# Patient Record
Sex: Male | Born: 1955 | Race: White | Hispanic: No | Marital: Married | Smoking: Never smoker
Health system: Southern US, Community
[De-identification: ages and names within clinical notes are randomized; demographics above are authoritative.]

## PROBLEM LIST (undated history)

## (undated) DIAGNOSIS — F988 Other specified behavioral and emotional disorders with onset usually occurring in childhood and adolescence: Secondary | ICD-10-CM

## (undated) DIAGNOSIS — M109 Gout, unspecified: Secondary | ICD-10-CM

## (undated) DIAGNOSIS — E119 Type 2 diabetes mellitus without complications: Secondary | ICD-10-CM

## (undated) DIAGNOSIS — G4730 Sleep apnea, unspecified: Secondary | ICD-10-CM

## (undated) DIAGNOSIS — E785 Hyperlipidemia, unspecified: Secondary | ICD-10-CM

## (undated) DIAGNOSIS — I10 Essential (primary) hypertension: Secondary | ICD-10-CM

## (undated) HISTORY — DX: Other specified behavioral and emotional disorders with onset usually occurring in childhood and adolescence: F98.8

## (undated) HISTORY — PX: APPENDECTOMY: SHX54

## (undated) HISTORY — PX: HERNIA REPAIR: SHX51

## (undated) HISTORY — DX: Type 2 diabetes mellitus without complications: E11.9

## (undated) HISTORY — PX: FRACTURE SURGERY: SHX138

## (undated) HISTORY — DX: Sleep apnea, unspecified: G47.30

## (undated) HISTORY — DX: Hyperlipidemia, unspecified: E78.5

---

## 2004-05-04 ENCOUNTER — Ambulatory Visit: Payer: Self-pay | Admitting: Family Medicine

## 2008-10-30 ENCOUNTER — Ambulatory Visit: Payer: Self-pay | Admitting: Family Medicine

## 2008-11-13 ENCOUNTER — Ambulatory Visit: Payer: Self-pay | Admitting: Family Medicine

## 2009-06-17 DIAGNOSIS — I152 Hypertension secondary to endocrine disorders: Secondary | ICD-10-CM | POA: Insufficient documentation

## 2009-06-17 DIAGNOSIS — E559 Vitamin D deficiency, unspecified: Secondary | ICD-10-CM | POA: Insufficient documentation

## 2011-03-29 IMAGING — CR CERVICAL SPINE - 2-3 VIEW
1 series · 4 of 4 positions shown · non-contrast
Comparison: none

REASON FOR EXAM: pneumonia, neck pain
COMMENTS:

[Series 2: view not recorded · 0.17mm/px · 4 of 4 slices shown]
[im 1/4]
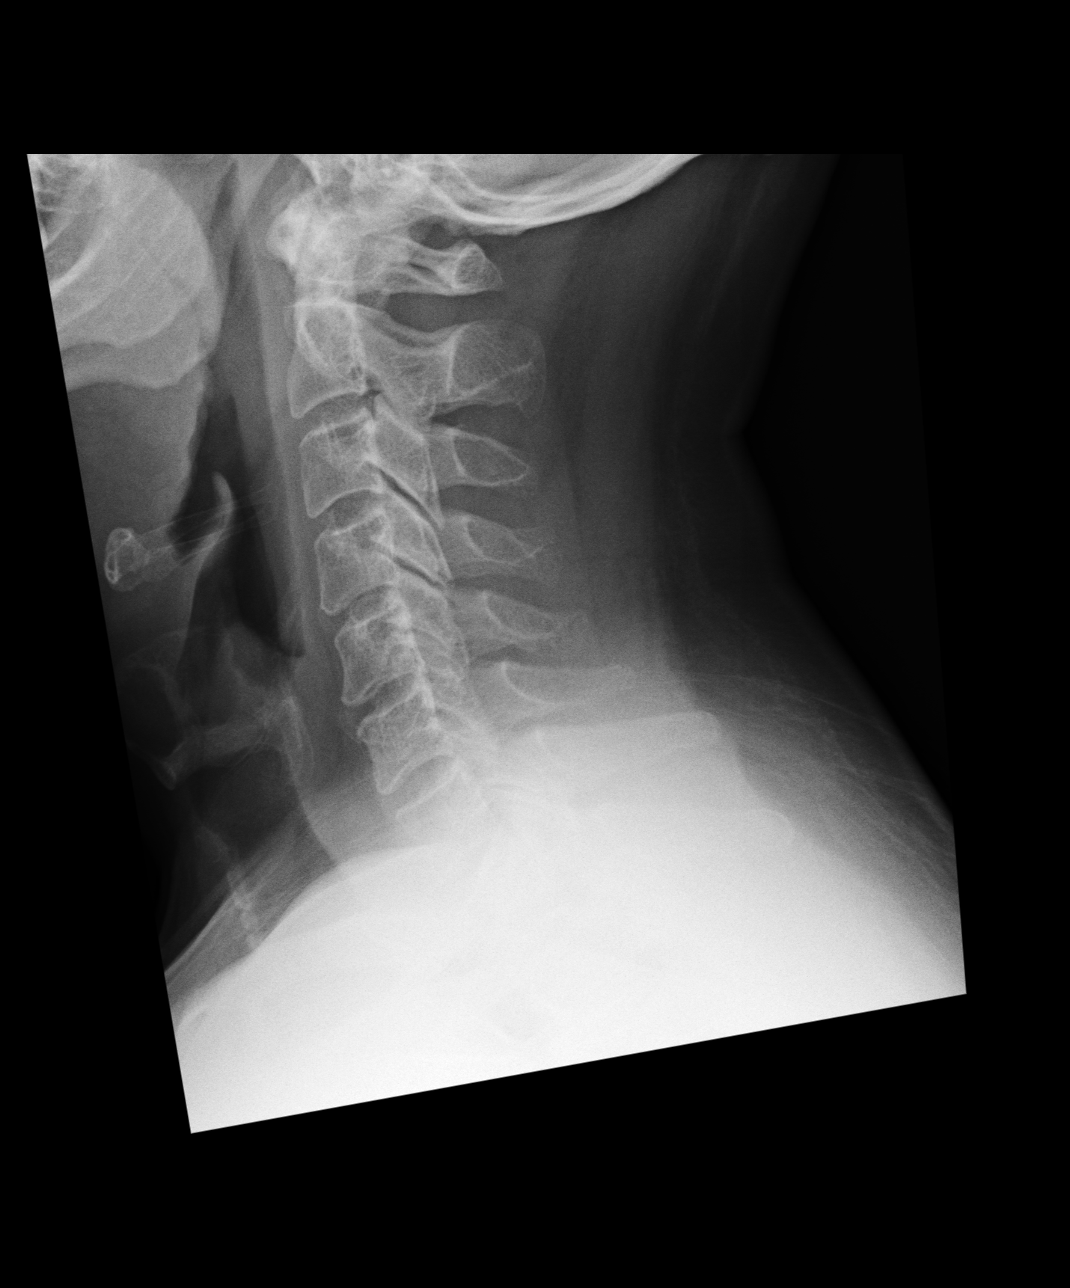
[im 2/4]
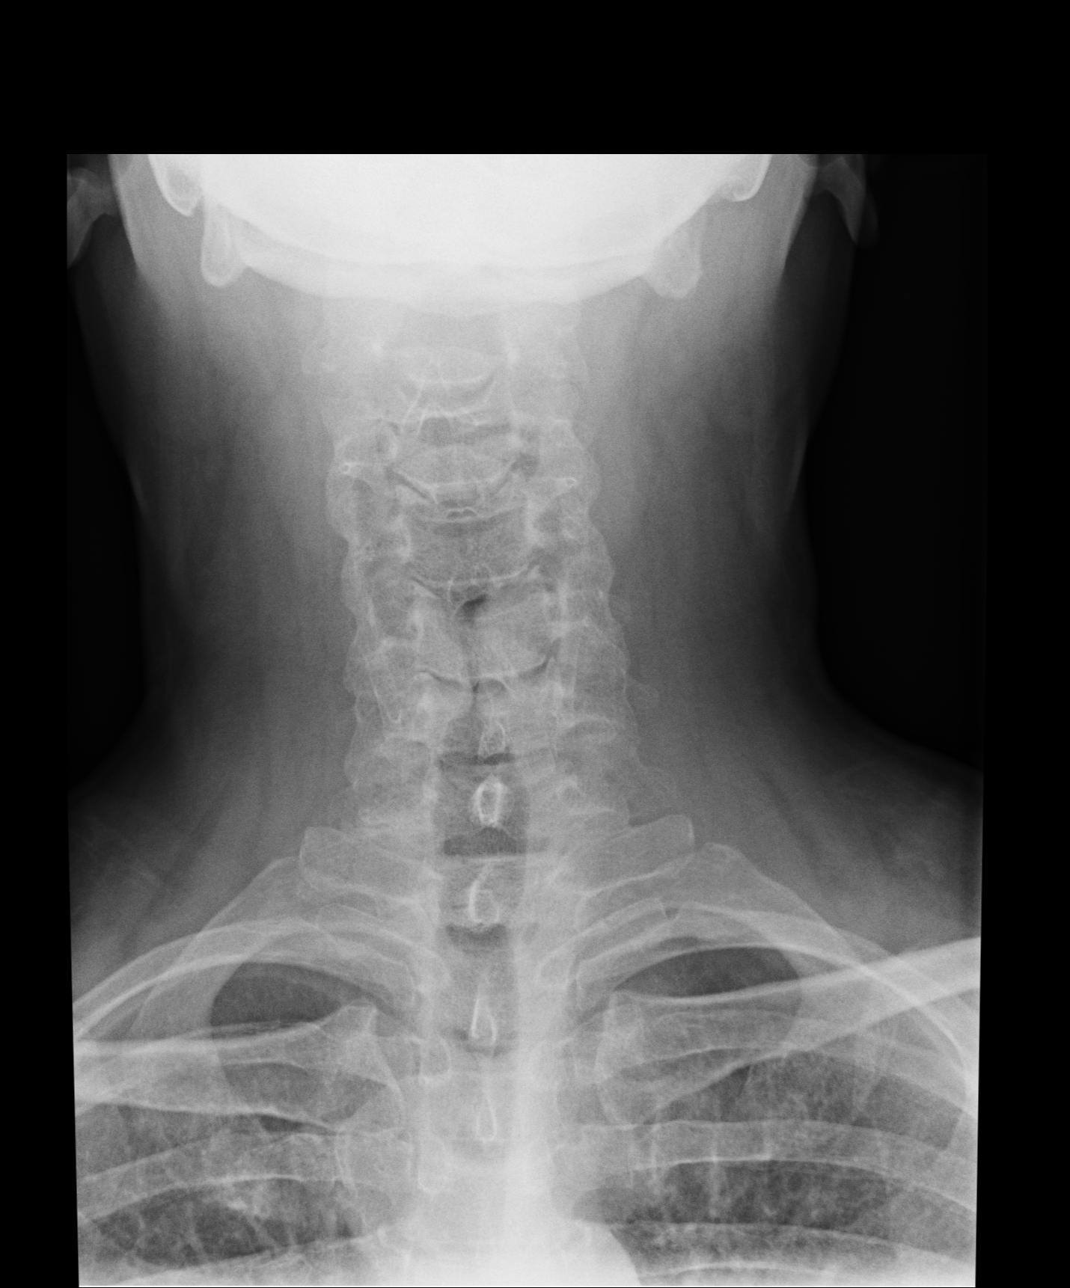
[im 3/4]
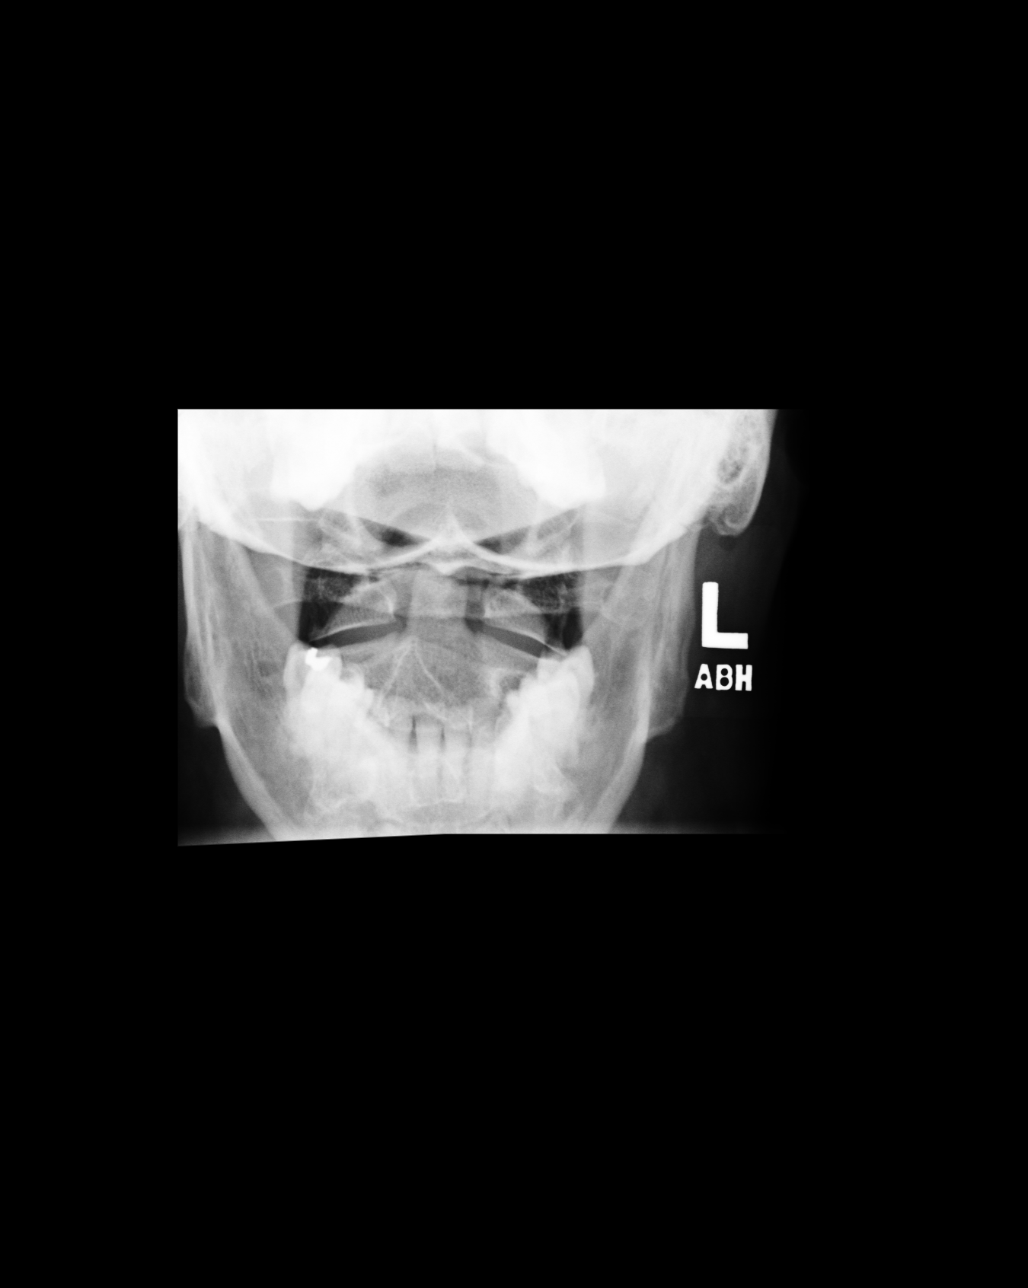
[im 4/4]
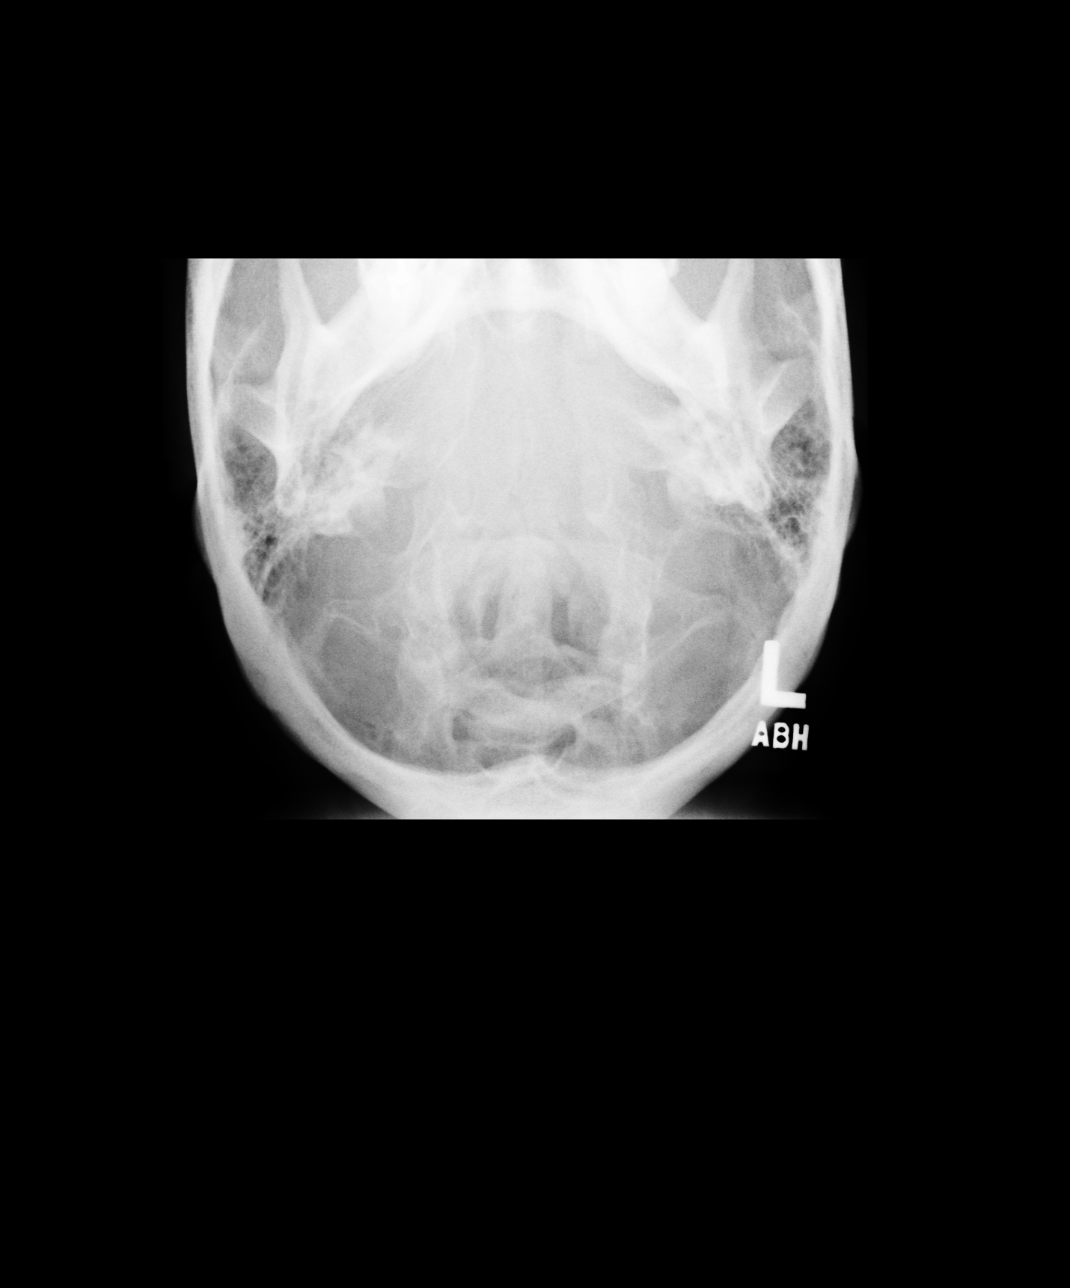

[4 of 4 positions shown; findings below may reference images not displayed]

PROCEDURE:     KDR - KDXR C-SPINE AP AND LATERAL  - October 30, 2008 [DATE]

RESULT:     The cervical vertebral bodies are preserved in height. There is
gentle curvature of the lower and upper cervical spine which may be related
to muscle spasm. The odontoid appears intact and the lateral masses of C1
align normally with those of C2. The prevertebral soft tissue spaces appear
normal. The posterior elements are grossly intact.
IMPRESSION: 1. I do not see acute bony abnormality of the cervical spine nor evidence of
more than very minimal degenerative type change.
2. There may be muscle spasm present.
3. I do not see evidence of soft tissue gas nor evidence of mass effect upon
the cervical airway.

## 2011-04-12 IMAGING — CR DG CHEST 2V
1 series · 2 of 2 positions shown · non-contrast
Comparison: none

REASON FOR EXAM: pneumonia
COMMENTS:

[Series 1: view not recorded · 0.17mm/px · 2 of 2 slices shown]
[im 1/2]
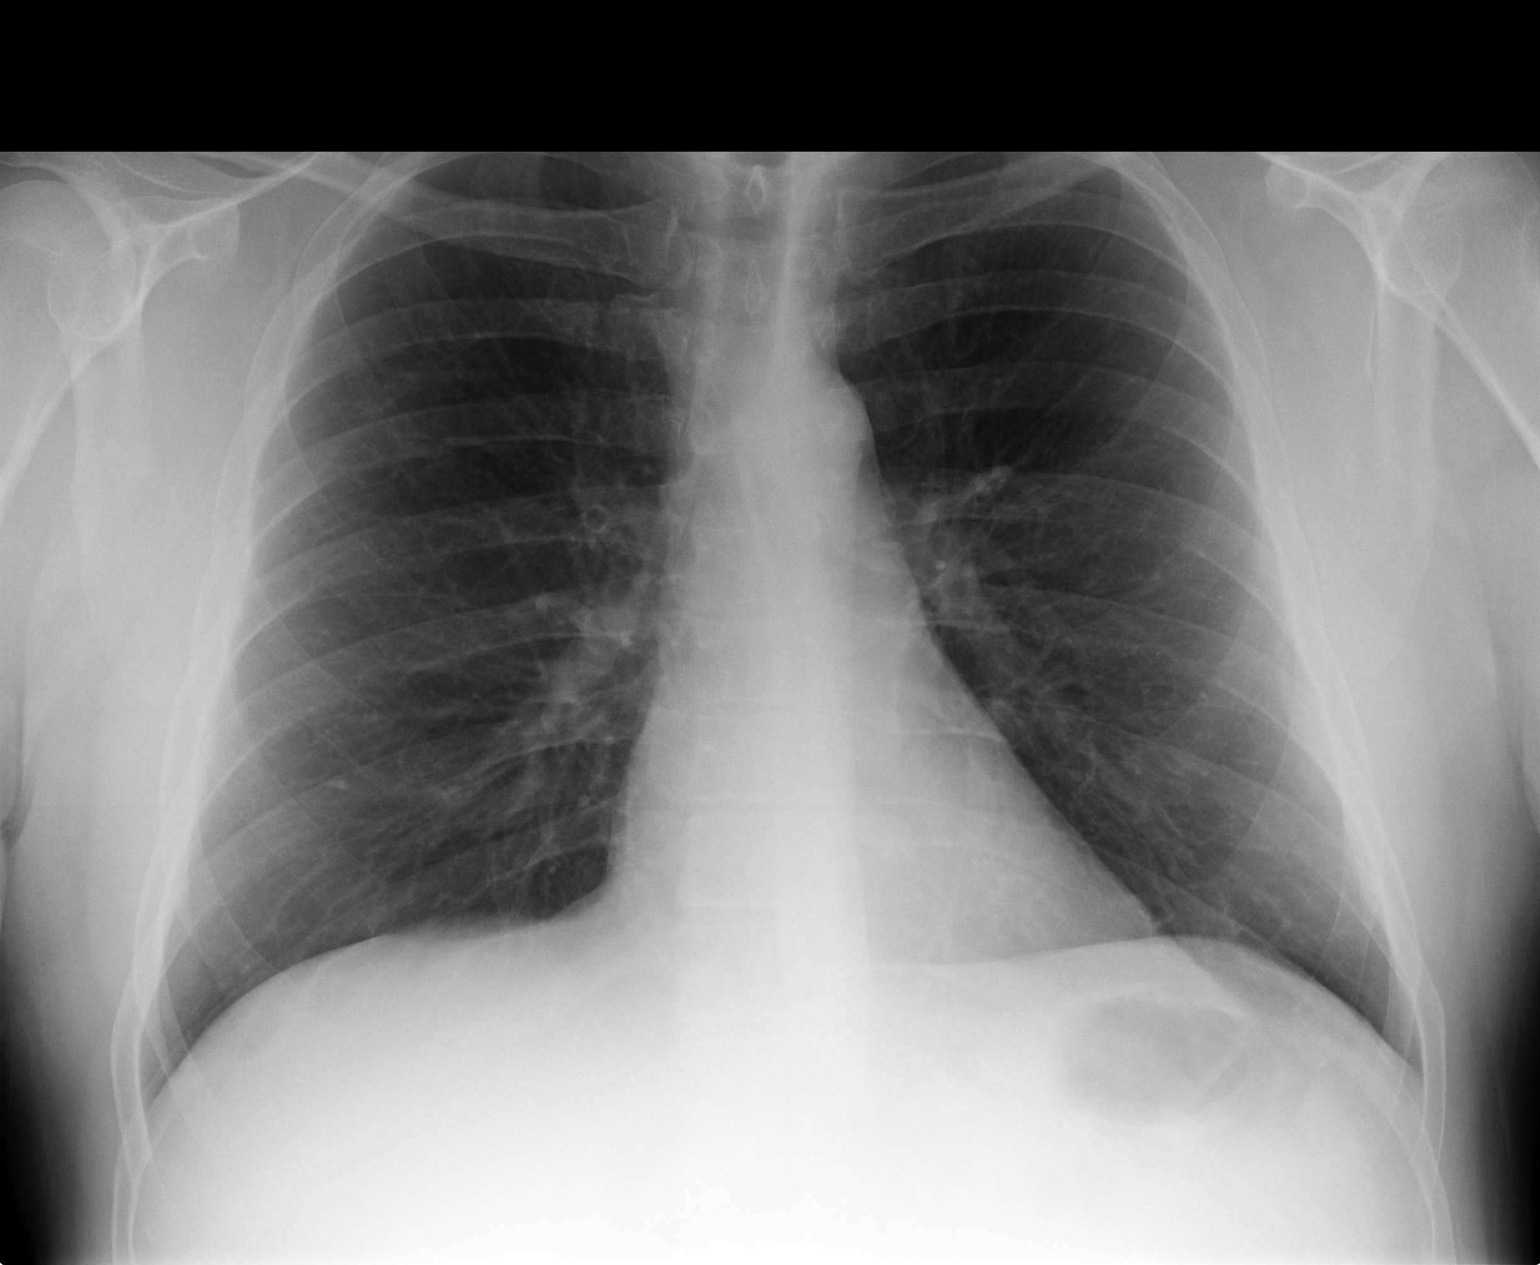
[im 2/2]
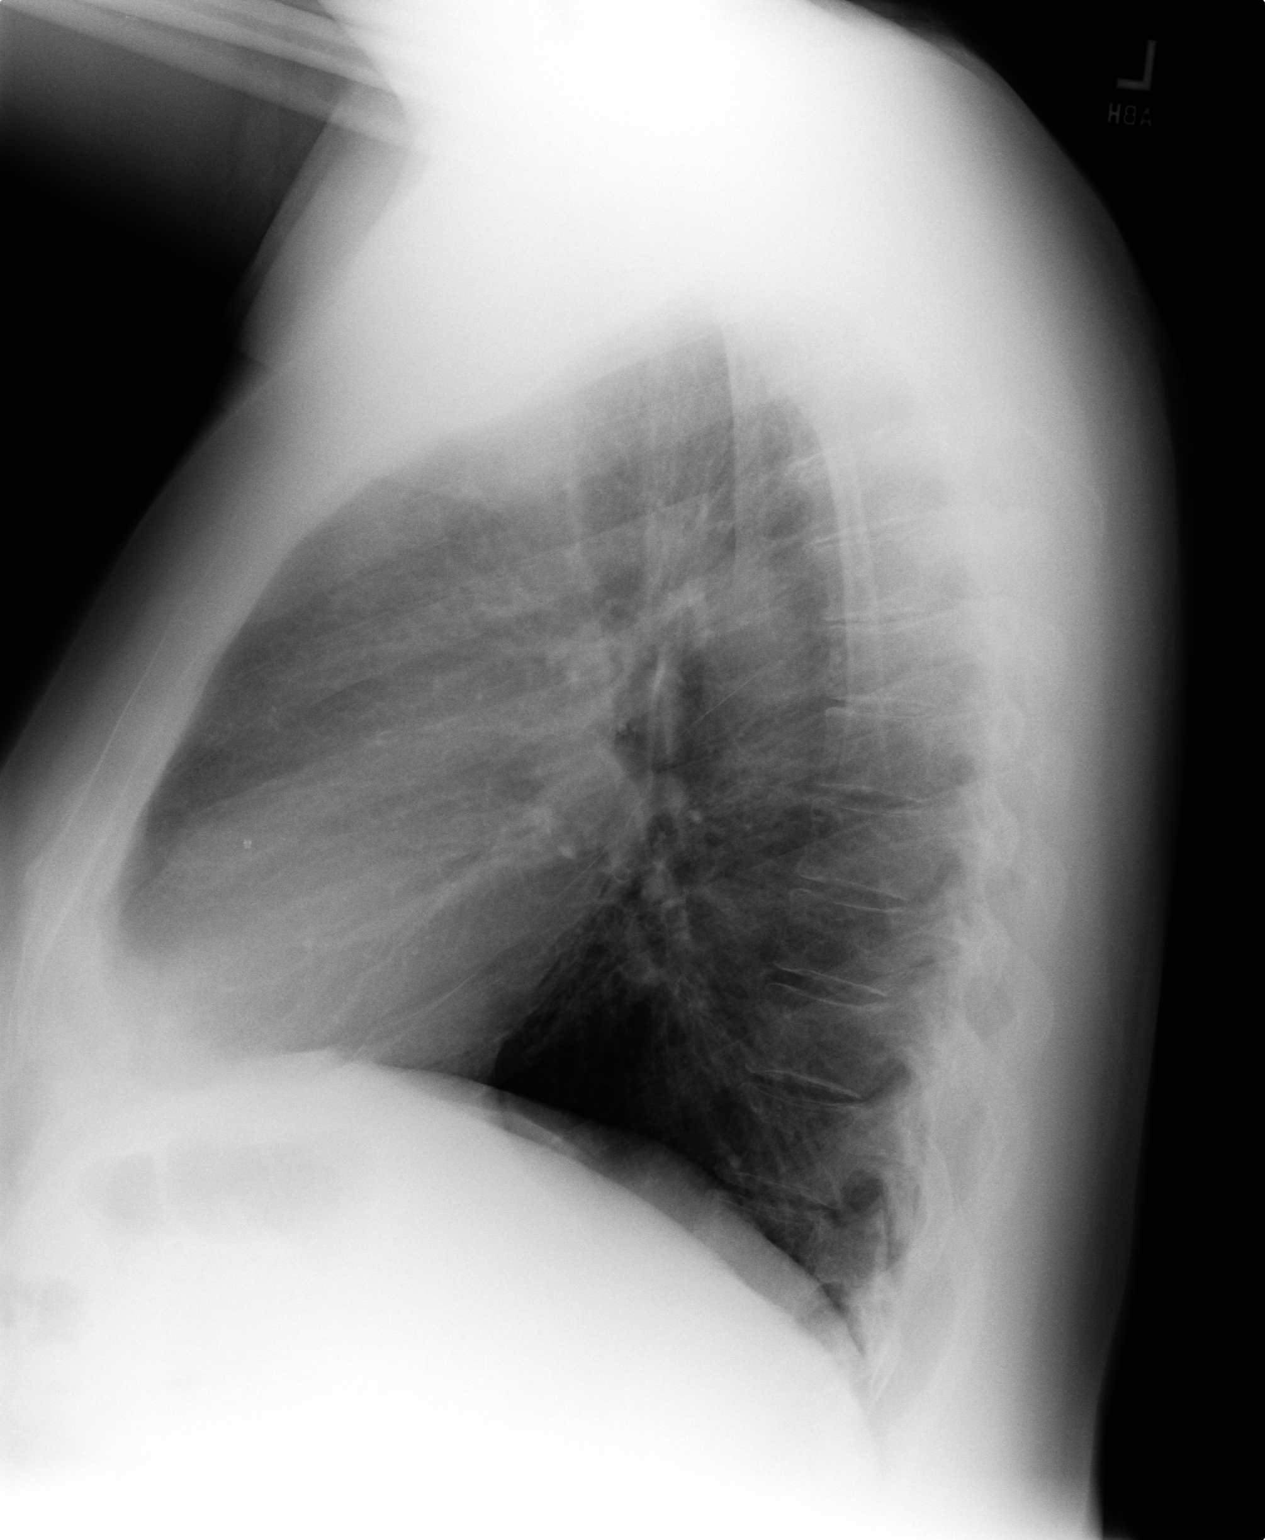

[2 of 2 positions shown; findings below may reference images not displayed]

PROCEDURE:     KDR - KDXR CHEST PA (OR AP) AND LAT  - November 13, 2008  [DATE]

RESULT:     Comparison is made to the exam of 10/30/2008.

The lungs are clear. The heart and pulmonary vessels are normal. The bony
and mediastinal structures are unremarkable. There is no effusion. There is
no pneumothorax or evidence of congestive failure.
IMPRESSION: No acute cardiopulmonary disease.

## 2013-06-06 ENCOUNTER — Ambulatory Visit: Payer: Self-pay | Admitting: Family Medicine

## 2013-09-27 LAB — LIPID PANEL
Cholesterol: 169 mg/dL (ref 0–200)
HDL: 60 mg/dL (ref 35–70)
LDL CALC: 82 mg/dL
LDL/HDL RATIO: 1.4
Triglycerides: 134 mg/dL (ref 40–160)

## 2013-09-27 LAB — CBC AND DIFFERENTIAL
HCT: 43 % (ref 41–53)
Hemoglobin: 14.1 g/dL (ref 13.5–17.5)
Neutrophils Absolute: 2 /uL
Platelets: 296 10*3/uL (ref 150–399)
WBC: 4.5 10*3/mL

## 2013-09-27 LAB — BASIC METABOLIC PANEL
BUN: 20 mg/dL (ref 4–21)
CREATININE: 1.1 mg/dL (ref 0.6–1.3)
GLUCOSE: 129 mg/dL
Potassium: 4.4 mmol/L (ref 3.4–5.3)
Sodium: 137 mmol/L (ref 137–147)

## 2013-09-27 LAB — HEPATIC FUNCTION PANEL
ALT: 38 U/L (ref 10–40)
AST: 26 U/L (ref 14–40)
Alkaline Phosphatase: 76 U/L (ref 25–125)

## 2013-09-27 LAB — PSA: PSA: 1.9

## 2013-09-27 LAB — TSH: TSH: 1.83 u[IU]/mL (ref 0.41–5.90)

## 2014-05-22 LAB — HEMOGLOBIN A1C: Hgb A1c MFr Bld: 6.4 % — AB (ref 4.0–6.0)

## 2014-09-21 DIAGNOSIS — R739 Hyperglycemia, unspecified: Secondary | ICD-10-CM | POA: Insufficient documentation

## 2014-09-21 DIAGNOSIS — F988 Other specified behavioral and emotional disorders with onset usually occurring in childhood and adolescence: Secondary | ICD-10-CM | POA: Insufficient documentation

## 2014-09-21 DIAGNOSIS — M109 Gout, unspecified: Secondary | ICD-10-CM | POA: Insufficient documentation

## 2014-09-21 DIAGNOSIS — E669 Obesity, unspecified: Secondary | ICD-10-CM | POA: Insufficient documentation

## 2014-09-21 DIAGNOSIS — E7800 Pure hypercholesterolemia, unspecified: Secondary | ICD-10-CM | POA: Insufficient documentation

## 2014-09-21 DIAGNOSIS — G4730 Sleep apnea, unspecified: Secondary | ICD-10-CM | POA: Insufficient documentation

## 2014-09-21 DIAGNOSIS — E1169 Type 2 diabetes mellitus with other specified complication: Secondary | ICD-10-CM | POA: Insufficient documentation

## 2014-10-11 ENCOUNTER — Ambulatory Visit (INDEPENDENT_AMBULATORY_CARE_PROVIDER_SITE_OTHER): Payer: BLUE CROSS/BLUE SHIELD | Admitting: Family Medicine

## 2014-10-11 ENCOUNTER — Encounter: Payer: Self-pay | Admitting: Family Medicine

## 2014-10-11 VITALS — BP 118/66 | HR 94 | Temp 98.6°F | Resp 12 | Ht 67.0 in | Wt 206.0 lb

## 2014-10-11 DIAGNOSIS — F988 Other specified behavioral and emotional disorders with onset usually occurring in childhood and adolescence: Secondary | ICD-10-CM

## 2014-10-11 DIAGNOSIS — Z125 Encounter for screening for malignant neoplasm of prostate: Secondary | ICD-10-CM

## 2014-10-11 DIAGNOSIS — Z0000 Encounter for general adult medical examination without abnormal findings: Secondary | ICD-10-CM | POA: Diagnosis not present

## 2014-10-11 DIAGNOSIS — Z1211 Encounter for screening for malignant neoplasm of colon: Secondary | ICD-10-CM | POA: Diagnosis not present

## 2014-10-11 DIAGNOSIS — F909 Attention-deficit hyperactivity disorder, unspecified type: Secondary | ICD-10-CM

## 2014-10-11 DIAGNOSIS — Z23 Encounter for immunization: Secondary | ICD-10-CM | POA: Diagnosis not present

## 2014-10-11 LAB — POCT URINALYSIS DIPSTICK
BILIRUBIN UA: NEGATIVE
Glucose, UA: NEGATIVE
Ketones, UA: NEGATIVE
Leukocytes, UA: NEGATIVE
NITRITE UA: NEGATIVE
PH UA: 6
Protein, UA: NEGATIVE
RBC UA: NEGATIVE
SPEC GRAV UA: 1.015
Urobilinogen, UA: NEGATIVE

## 2014-10-11 LAB — IFOBT (OCCULT BLOOD): IMMUNOLOGICAL FECAL OCCULT BLOOD TEST: NEGATIVE

## 2014-10-11 MED ORDER — AMPHETAMINE-DEXTROAMPHETAMINE 10 MG PO TABS: 10.0000 mg | ORAL_TABLET | Freq: Every day | ORAL | Status: DC

## 2014-10-11 MED ORDER — ALLOPURINOL 300 MG PO TABS: 300.0000 mg | ORAL_TABLET | Freq: Every day | ORAL | Status: DC

## 2014-10-11 MED ORDER — SIMVASTATIN 20 MG PO TABS: 20.0000 mg | ORAL_TABLET | Freq: Every day | ORAL | Status: DC

## 2014-10-11 NOTE — Progress Notes (Signed)
Patient ID: Antonio Whitney, male   DOB: [birth year], 59 y.o.   MRN: [id number]  Visit Date: 10/11/2014  Today's Provider: [name], MD   Chief Complaint  Patient presents with  . Annual Exam   Subjective:  Antonio Whitney is a 59 y.o. male who presents today for health maintenance and complete physical. He feels well. He reports exercising x daily walking, push ups, bicycle riding. He reports he is sleeping well.  LAST: EKG-03/10/12  Colonoscopy-12/21/05 hemorrhoids, diverticulosis, repeat in 10 years.  Tdap-07/01/11  Review of Systems  Constitutional: Negative.   HENT: Negative.   Eyes: Negative.   Respiratory: Negative.   Cardiovascular: Negative.   Gastrointestinal: Negative.   Endocrine: Negative.   Genitourinary: Negative.   Musculoskeletal: Negative.   Skin: Negative.   Allergic/Immunologic: Negative.   Neurological: Negative.   Hematological: Negative.   Psychiatric/Behavioral: Negative.     Social History   Social History  . Marital Status: Married    Spouse Name: [name]  . Number of Children: 2  . Years of Education: 16   Occupational History  . financial consulting now    Social History Main Topics  . Smoking status: Never Smoker   . Smokeless tobacco: Never Used  . Alcohol Use: No  . Drug Use: No  . Sexual Activity: Yes   Other Topics Concern  . Not on file   Social History Narrative    Patient Active Problem List   Diagnosis Date Noted  . ADD (attention deficit disorder) 09/21/2014  . Elevated blood sugar 09/21/2014  . Gout 09/21/2014  . Hypercholesteremia 09/21/2014  . Adiposity 09/21/2014  . Apnea, sleep 09/21/2014  . Alopecia 06/17/2009  . Colon, diverticulosis 06/17/2009  . Essential (primary) hypertension 06/17/2009  . Avitaminosis D 06/17/2009    Past Surgical History  Procedure Laterality Date  . Hernia repair      inguinal left-1980  . Appendectomy      His family history includes Breast cancer in his mother; Lung  cancer in his father.    Outpatient Prescriptions Prior to Visit  Medication Sig Dispense Refill  . allopurinol (ZYLOPRIM) 300 MG tablet Take by mouth.    Marland Kitchen amLODipine-benazepril (LOTREL) 5-20 MG per capsule Take by mouth.    Marland Kitchen amphetamine-dextroamphetamine (ADDERALL) 10 MG tablet Take by mouth.    Marland Kitchen aspirin 81 MG tablet Take by mouth.    . finasteride (PROSCAR) 5 MG tablet Take by mouth.    . Inulin (METAMUCIL CLEAR & NATURAL) POWD Take by mouth.    . Multiple Vitamins-Minerals (CENTRUM SILVER PO) Take by mouth.    . Omega-3 Fatty Acids (FISH OIL) 1200 MG CAPS Take by mouth.    . simvastatin (ZOCOR) 20 MG tablet Take by mouth.     No facility-administered medications prior to visit.    Patient Care Team: Maple Hudson., MD as PCP - General (Family Medicine)     Objective:   Vitals:  Filed Vitals:   10/11/14 0914  BP: 118/66  Pulse: 94  Temp: 98.6 F (37 C)  Resp: 12  Height: 5\' 7"  (1.702 m)  Weight: 206 lb (93.441 kg)    Physical Exam   Depression Screen PHQ 2/9 Scores 10/11/2014  PHQ - 2 Score 0      Assessment & Plan:    1. Annual physical exam Walking daily. - CBC with Differential/Platelet - Comprehensive Metabolic Panel (CMET) - Lipid Panel With LDL/HDL Ratio - POCT urinalysis dipstick - TSH  2.  Prostate cancer screening  - PSA  3. Colon cancer screening  - IFOBT POC (occult bld, rslt in office)  4. Need for influenza vaccination  - Flu Vaccine QUAD 36+ mos IM (Fluarix & Fluzone Quad PF  5. ADD (attention deficit disorder)  - amphetamine-dextroamphetamine (ADDERALL) 10 MG tablet; Take 1 tablet (10 mg total) by mouth daily.  Dispense: 30 tablet; Refill: 0

## 2014-10-12 LAB — CBC WITH DIFFERENTIAL/PLATELET
Basophils Absolute: 0 10*3/uL (ref 0.0–0.2)
Basos: 0 %
EOS (ABSOLUTE): 0 10*3/uL (ref 0.0–0.4)
Eos: 1 %
Hematocrit: 42 % (ref 37.5–51.0)
Hemoglobin: 14.2 g/dL (ref 12.6–17.7)
Immature Grans (Abs): 0 10*3/uL (ref 0.0–0.1)
Immature Granulocytes: 0 %
Lymphocytes Absolute: 1.3 10*3/uL (ref 0.7–3.1)
Lymphs: 22 %
MCH: 30 pg (ref 26.6–33.0)
MCHC: 33.8 g/dL (ref 31.5–35.7)
MCV: 89 fL (ref 79–97)
MONOS ABS: 0.6 10*3/uL (ref 0.1–0.9)
Monocytes: 10 %
Neutrophils Absolute: 4 10*3/uL (ref 1.4–7.0)
Neutrophils: 67 %
PLATELETS: 339 10*3/uL (ref 150–379)
RBC: 4.73 x10E6/uL (ref 4.14–5.80)
RDW: 13.2 % (ref 12.3–15.4)
WBC: 5.9 10*3/uL (ref 3.4–10.8)

## 2014-10-12 LAB — COMPREHENSIVE METABOLIC PANEL
A/G RATIO: 1.7 (ref 1.1–2.5)
ALT: 30 IU/L (ref 0–44)
AST: 21 IU/L (ref 0–40)
Albumin: 4.7 g/dL (ref 3.5–5.5)
Alkaline Phosphatase: 72 IU/L (ref 39–117)
BUN/Creatinine Ratio: 15 (ref 9–20)
BUN: 16 mg/dL (ref 6–24)
Bilirubin Total: 0.5 mg/dL (ref 0.0–1.2)
CO2: 24 mmol/L (ref 18–29)
Calcium: 10.1 mg/dL (ref 8.7–10.2)
Chloride: 100 mmol/L (ref 97–108)
Creatinine, Ser: 1.07 mg/dL (ref 0.76–1.27)
GFR calc Af Amer: 87 mL/min/{1.73_m2} (ref >59)
GFR calc non Af Amer: 76 mL/min/{1.73_m2} (ref >59)
Globulin, Total: 2.8 g/dL (ref 1.5–4.5)
Glucose: 132 mg/dL — ABNORMAL HIGH (ref 65–99)
POTASSIUM: 4.5 mmol/L (ref 3.5–5.2)
Sodium: 140 mmol/L (ref 134–144)
Total Protein: 7.5 g/dL (ref 6.0–8.5)

## 2014-10-12 LAB — LIPID PANEL WITH LDL/HDL RATIO
CHOLESTEROL TOTAL: 166 mg/dL (ref 100–199)
HDL: 67 mg/dL (ref >39)
LDL Calculated: 79 mg/dL (ref 0–99)
LDL/HDL RATIO: 1.2 ratio (ref 0.0–3.6)
TRIGLYCERIDES: 98 mg/dL (ref 0–149)
VLDL Cholesterol Cal: 20 mg/dL (ref 5–40)

## 2014-10-12 LAB — TSH: TSH: 1.44 u[IU]/mL (ref 0.450–4.500)

## 2014-10-12 LAB — PSA: Prostate Specific Ag, Serum: 1.6 ng/mL (ref 0.0–4.0)

## 2015-01-07 ENCOUNTER — Other Ambulatory Visit: Payer: Self-pay

## 2015-01-07 ENCOUNTER — Telehealth: Payer: Self-pay | Admitting: Family Medicine

## 2015-01-07 DIAGNOSIS — F988 Other specified behavioral and emotional disorders with onset usually occurring in childhood and adolescence: Secondary | ICD-10-CM

## 2015-01-07 MED ORDER — AMPHETAMINE-DEXTROAMPHETAMINE 10 MG PO TABS: 10.0000 mg | ORAL_TABLET | Freq: Every day | ORAL | Status: DC

## 2015-01-07 NOTE — Telephone Encounter (Signed)
Patient is leaving this morning going out of town on an unexpected trip and will be gone for a whole week. He is needing a refill on his amphetamine-dextroamphetamine (ADDERALL) 10 MG tablet Patient is willing to wait for you to get this RX filled.

## 2015-02-07 ENCOUNTER — Other Ambulatory Visit: Payer: Self-pay | Admitting: Family Medicine

## 2015-02-07 DIAGNOSIS — F988 Other specified behavioral and emotional disorders with onset usually occurring in childhood and adolescence: Secondary | ICD-10-CM

## 2015-02-07 MED ORDER — AMLODIPINE BESY-BENAZEPRIL HCL 5-20 MG PO CAPS: 1.0000 | ORAL_CAPSULE | Freq: Every day | ORAL | Status: DC

## 2015-02-07 MED ORDER — AMPHETAMINE-DEXTROAMPHETAMINE 10 MG PO TABS: 10.0000 mg | ORAL_TABLET | Freq: Every day | ORAL | Status: DC

## 2015-02-07 NOTE — Telephone Encounter (Signed)
Last ov was 10/11/2014.

## 2015-02-07 NOTE — Telephone Encounter (Signed)
Prescription printed. Please notify patient it is ready for pick up. Thanks- Dr. Jagger Demonte.  

## 2015-02-07 NOTE — Telephone Encounter (Signed)
Pt needs refills on amphetamine-dextroamphetamine (ADDERALL) 10 MG tablet  Also need amLODipine-benazepril (LOTREL) 5-20 MG per capsule if hasnt been called in to his pharmacy.  He thinks it may have already been sent in.  He needs the Adderall by tomorrow afternoon.  He is a patient of Dr. Wonda OldsGilberts  Please call when ready to pick up [phone]  Thanks, Antonio Whitney

## 2015-02-18 ENCOUNTER — Ambulatory Visit: Payer: BLUE CROSS/BLUE SHIELD | Admitting: Family Medicine

## 2015-02-19 ENCOUNTER — Ambulatory Visit (INDEPENDENT_AMBULATORY_CARE_PROVIDER_SITE_OTHER): Payer: BC Managed Care – PPO | Admitting: Family Medicine

## 2015-02-19 ENCOUNTER — Encounter: Payer: Self-pay | Admitting: Family Medicine

## 2015-02-19 VITALS — BP 122/70 | HR 122 | Temp 97.8°F | Resp 16 | Wt 204.0 lb

## 2015-02-19 DIAGNOSIS — I10 Essential (primary) hypertension: Secondary | ICD-10-CM

## 2015-02-19 DIAGNOSIS — R7309 Other abnormal glucose: Secondary | ICD-10-CM | POA: Diagnosis not present

## 2015-02-19 DIAGNOSIS — F988 Other specified behavioral and emotional disorders with onset usually occurring in childhood and adolescence: Secondary | ICD-10-CM

## 2015-02-19 DIAGNOSIS — R42 Dizziness and giddiness: Secondary | ICD-10-CM

## 2015-02-19 DIAGNOSIS — F909 Attention-deficit hyperactivity disorder, unspecified type: Secondary | ICD-10-CM | POA: Diagnosis not present

## 2015-02-19 DIAGNOSIS — R739 Hyperglycemia, unspecified: Secondary | ICD-10-CM

## 2015-02-19 LAB — POCT GLYCOSYLATED HEMOGLOBIN (HGB A1C): Hemoglobin A1C: 6

## 2015-02-19 MED ORDER — AMPHETAMINE-DEXTROAMPHETAMINE 10 MG PO TABS: 10.0000 mg | ORAL_TABLET | Freq: Every day | ORAL | Status: DC

## 2015-02-19 NOTE — Progress Notes (Signed)
Patient ID: Antonio Whitney, male   DOB: Jan 27, 1956, 60 y.o.   MRN: [id number]    Subjective:  HPI  Pre-Diabetes, Follow-up:   Lab Results  Component Value Date   HGBA1C 6.4* 05/22/2014    Last seen for pre-diabetes 4 months ago.  Management since then includes none. He reports good compliance with treatment. He is not having side effects.  Current symptoms include none Home blood sugar records: not being checked  Episodes of hypoglycemia? no   Current Insulin Regimen: n/a Current exercise: aerobics and bicycling, tried to do something daily.  Pertinent Labs:    Component Value Date/Time   CHOL 166 10/11/2014 0959   CHOL 169 09/27/2013   TRIG 98 10/11/2014 0959   HDL 67 10/11/2014 0959   HDL 60 09/27/2013   LDLCALC 79 10/11/2014 0959   LDLCALC 82 09/27/2013   CREATININE 1.07 10/11/2014 0959   CREATININE 1.1 09/27/2013    Wt Readings from Last 3 Encounters:  02/19/15 204 lb (92.534 kg)  10/11/14 206 lb (93.441 kg)  05/22/14 210 lb (95.255 kg)    ------------------------------------------------------------------------    Hypertension, follow-up:  BP Readings from Last 3 Encounters:  02/19/15 122/70  10/11/14 118/66  05/22/14 114/74    He was last seen for hypertension 4 months ago.  BP at that visit was 118/66. Management since that visit includes none. He reports good compliance with treatment. He is not having side effects.  He is exercising. He is adherent to low salt diet.   Outside blood pressures are not being checked. He is experiencing none.  Patient denies chest pain, chest pressure/discomfort, claudication, dyspnea, exertional chest pressure/discomfort, fatigue, irregular heart beat and palpitations.     Wt Readings from Last 3 Encounters:  02/19/15 204 lb (92.534 kg)  10/11/14 206 lb (93.441 kg)  05/22/14 210 lb (95.255 kg)   He is having some dizziness when he gets up in the mornings. Does not happen again through out the day, only in  the mornings when he gets up and takes his Cpap machine off. ------------------------------------------------------------------------ Pt would like to discuss coming off of the Adderall. He would like to see how he does without and see if it is a possibly to come off of it.     Prior to Admission medications   Medication Sig Start Date End Date Taking? Authorizing Provider  allopurinol (ZYLOPRIM) 300 MG tablet Take 1 tablet (300 mg total) by mouth daily. 10/11/14  Yes Richard Hulen Shouts., MD  amLODipine-benazepril (LOTREL) 5-20 MG capsule Take 1 capsule by mouth daily. 02/07/15  Yes Lorie Phenix, MD  amphetamine-dextroamphetamine (ADDERALL) 10 MG tablet Take 1 tablet (10 mg total) by mouth daily. 02/07/15  Yes Lorie Phenix, MD  aspirin 81 MG tablet Take by mouth. 04/21/10  Yes Historical Provider, MD  finasteride (PROSCAR) 5 MG tablet Take by mouth. 05/22/14  Yes Historical Provider, MD  Inulin (METAMUCIL CLEAR & NATURAL) POWD Take by mouth. 04/21/10  Yes Historical Provider, MD  Multiple Vitamins-Minerals (CENTRUM SILVER PO) Take by mouth.   Yes Historical Provider, MD  Omega-3 Fatty Acids (FISH OIL) 1200 MG CAPS Take by mouth. 04/21/10  Yes Historical Provider, MD  simvastatin (ZOCOR) 20 MG tablet Take 1 tablet (20 mg total) by mouth at bedtime. 10/11/14  Yes Richard Hulen Shouts., MD    Patient Active Problem List   Diagnosis Date Noted  . ADD (attention deficit disorder) 09/21/2014  . Elevated blood sugar 09/21/2014  . Gout 09/21/2014  .  Hypercholesteremia 09/21/2014  . Adiposity 09/21/2014  . Apnea, sleep 09/21/2014  . Alopecia 06/17/2009  . Colon, diverticulosis 06/17/2009  . Essential (primary) hypertension 06/17/2009  . Avitaminosis D 06/17/2009    History reviewed. No pertinent past medical history.  Social History   Social History  . Marital Status: Married    Spouse Name: Rosalita ChessmanSuzanne  . Number of Children: 2  . Years of Education: 16   Occupational History  . financial  consulting now    Social History Main Topics  . Smoking status: Never Smoker   . Smokeless tobacco: Never Used  . Alcohol Use: No  . Drug Use: No  . Sexual Activity: Yes   Other Topics Concern  . Not on file   Social History Narrative    No Known Allergies  Review of Systems  Constitutional: Negative.   HENT: Negative.   Eyes: Negative.   Respiratory: Negative.   Cardiovascular: Negative.   Gastrointestinal: Negative.   Genitourinary: Negative.   Musculoskeletal: Negative.   Skin: Negative.   Neurological: Positive for dizziness (when he first gets up in the mornings but not during the day.).  Endo/Heme/Allergies: Negative.   Psychiatric/Behavioral: Negative.     Immunization History  Administered Date(s) Administered  . Influenza,inj,Quad PF,36+ Mos 10/11/2014  . Tdap 07/01/2011   Objective:  BP 122/70 mmHg  Pulse 122  Temp(Src) 97.8 F (36.6 C) (Oral)  Resp 16  Wt 204 lb (92.534 kg)  Orthostatic VS for the past 24 hrs (Last 3 readings):  BP- Lying Pulse- Lying BP- Sitting Pulse- Sitting BP- Standing at 0 minutes Pulse- Standing at 0 minutes  02/19/15 1219 122/80 mmHg 90 128/78 mmHg 91 122/80 mmHg 94    Physical Exam  Constitutional: He is oriented to person, place, and time and well-developed, well-nourished, and in no distress.  HENT:  Head: Normocephalic and atraumatic.  Right Ear: External ear normal.  Left Ear: External ear normal.  Nose: Nose normal.  Eyes: Conjunctivae and EOM are normal. Pupils are equal, round, and reactive to light.  Neck: Normal range of motion. Neck supple.  Cardiovascular: Normal rate, regular rhythm, normal heart sounds and intact distal pulses.   Pulmonary/Chest: Effort normal and breath sounds normal.  Abdominal: Soft. Bowel sounds are normal.  Musculoskeletal: Normal range of motion.  Neurological: He is alert and oriented to person, place, and time. He has normal reflexes. Gait normal. GCS score is 15.  Grossly  nonfocal.  Skin: Skin is warm and dry.  Psychiatric: Mood, memory, affect and judgment normal.    Lab Results  Component Value Date   WBC 5.9 10/11/2014   HGB 14.1 09/27/2013   HCT 42.0 10/11/2014   PLT 339 10/11/2014   GLUCOSE 132* 10/11/2014   CHOL 166 10/11/2014   TRIG 98 10/11/2014   HDL 67 10/11/2014   LDLCALC 79 10/11/2014   TSH 1.440 10/11/2014   PSA 1.6 10/11/2014   HGBA1C 6.4* 05/22/2014    CMP     Component Value Date/Time   NA 140 10/11/2014 0959   K 4.5 10/11/2014 0959   CL 100 10/11/2014 0959   CO2 24 10/11/2014 0959   GLUCOSE 132* 10/11/2014 0959   BUN 16 10/11/2014 0959   CREATININE 1.07 10/11/2014 0959   CREATININE 1.1 09/27/2013   CALCIUM 10.1 10/11/2014 0959   PROT 7.5 10/11/2014 0959   ALBUMIN 4.7 10/11/2014 0959   AST 21 10/11/2014 0959   ALT 30 10/11/2014 0959   ALKPHOS 72 10/11/2014 0959   BILITOT  0.5 10/11/2014 0959   GFRNONAA 76 10/11/2014 0959   GFRAA 87 10/11/2014 0959    Assessment and Plan :  1. Elevated blood sugar  - POCT HgB A1C- 6.0 today. Improved with diet and exercise.  2. ADD (attention deficit disorder)- May consider coming off Adderall when his job gets more stable. Reassess in the spring. 3 Refills given today. Pt will try to wean off of this over the next few weeks to months. - amphetamine-dextroamphetamine (ADDERALL) 10 MG tablet; Take 1 tablet (10 mg total) by mouth daily.  Dispense: 30 tablet; Refill: 0  3. Essential (primary) hypertension- stable  4. Dizziness-/lilely mild orthostasis only when standing in the mornings, when first waking up. Orthostatics taken as above. Will call if dizziness gets worse or more frequent. Encouraged more fluids during the day. 5.Obesity D and E stressed.  Patient was seen and examined by Dr. Julieanne Manson, and noted scribed by Dimas Chyle, CMA  Julieanne Manson MD [facility] 02/19/2015 12:24 PM

## 2015-04-10 ENCOUNTER — Telehealth: Payer: Self-pay

## 2015-04-10 NOTE — Telephone Encounter (Signed)
Called to get PA complete with [facility] for Adderall and this was approved for 36 months. Pharmacy notified-aa

## 2015-06-08 ENCOUNTER — Other Ambulatory Visit: Payer: Self-pay | Admitting: Family Medicine

## 2015-06-24 LAB — HM DIABETES EYE EXAM

## 2015-07-16 ENCOUNTER — Other Ambulatory Visit: Payer: Self-pay | Admitting: Family Medicine

## 2015-07-16 DIAGNOSIS — F988 Other specified behavioral and emotional disorders with onset usually occurring in childhood and adolescence: Secondary | ICD-10-CM

## 2015-07-16 MED ORDER — AMPHETAMINE-DEXTROAMPHETAMINE 10 MG PO TABS: 10.0000 mg | ORAL_TABLET | Freq: Every day | ORAL | Status: DC

## 2015-07-16 NOTE — Telephone Encounter (Signed)
Pt needs refill on amphetamine-dextroamphetamine (ADDERALL) 10 MG tablet  Please call when rady for pick up.  Call  Back [phone]  Thanks Barth Kirkseri

## 2015-07-16 NOTE — Telephone Encounter (Signed)
Ok times 1 

## 2015-07-16 NOTE — Telephone Encounter (Signed)
Medication printed to be signed. 

## 2015-07-16 NOTE — Telephone Encounter (Signed)
Please review-aa 

## 2015-07-17 NOTE — Telephone Encounter (Signed)
RX placed up front and pt advised-aa

## 2015-07-23 ENCOUNTER — Ambulatory Visit (INDEPENDENT_AMBULATORY_CARE_PROVIDER_SITE_OTHER): Payer: BC Managed Care – PPO | Admitting: Family Medicine

## 2015-07-23 VITALS — BP 118/68 | HR 96 | Temp 98.8°F | Resp 14 | Wt 203.0 lb

## 2015-07-23 DIAGNOSIS — E7800 Pure hypercholesterolemia, unspecified: Secondary | ICD-10-CM | POA: Diagnosis not present

## 2015-07-23 DIAGNOSIS — R7309 Other abnormal glucose: Secondary | ICD-10-CM

## 2015-07-23 DIAGNOSIS — F988 Other specified behavioral and emotional disorders with onset usually occurring in childhood and adolescence: Secondary | ICD-10-CM

## 2015-07-23 DIAGNOSIS — F909 Attention-deficit hyperactivity disorder, unspecified type: Secondary | ICD-10-CM

## 2015-07-23 DIAGNOSIS — I10 Essential (primary) hypertension: Secondary | ICD-10-CM | POA: Diagnosis not present

## 2015-07-23 DIAGNOSIS — E669 Obesity, unspecified: Secondary | ICD-10-CM | POA: Diagnosis not present

## 2015-07-23 DIAGNOSIS — R739 Hyperglycemia, unspecified: Secondary | ICD-10-CM

## 2015-07-23 MED ORDER — AMPHETAMINE-DEXTROAMPHETAMINE 10 MG PO TABS: 10.0000 mg | ORAL_TABLET | Freq: Every day | ORAL | Status: DC

## 2015-07-23 MED ORDER — AMLODIPINE BESY-BENAZEPRIL HCL 5-20 MG PO CAPS: 1.0000 | ORAL_CAPSULE | Freq: Every day | ORAL | Status: DC

## 2015-07-23 MED ORDER — SIMVASTATIN 20 MG PO TABS: 20.0000 mg | ORAL_TABLET | Freq: Every day | ORAL | Status: DC

## 2015-07-23 MED ORDER — ALLOPURINOL 300 MG PO TABS: 300.0000 mg | ORAL_TABLET | Freq: Every day | ORAL | Status: DC

## 2015-07-23 NOTE — Progress Notes (Signed)
Patient ID: Antonio Whitney, male   DOB: 1955-11-09, 60 y.o.   MRN: [id number]    Subjective:  HPI  Patient is here for follow up. Last visit was in January 2017. Last labs in September 2016. Hypertension: Patient is not checking his b/p at home. No cardiac symptoms present. BP Readings from Last 3 Encounters:  07/23/15 118/68  02/19/15 122/70  10/11/14 118/66   Hyperlipidemia: patient is taking Simvastatin. Lab Results  Component Value Date   CHOL 166 10/11/2014   HDL 67 10/11/2014   LDLCALC 79 10/11/2014   TRIG 98 10/11/2014   Hyperglycemia: Patient does not check his sugar at home. He has been spinning about 30 minutes 5 days a week, tries to walk daily. Lab Results  Component Value Date   HGBA1C 6.0 02/19/2015   ADD: Patient takes Adderall 1 tablet daily.  Prior to Admission medications   Medication Sig Start Date End Date Taking? Authorizing Provider  allopurinol (ZYLOPRIM) 300 MG tablet Take 1 tablet (300 mg total) by mouth daily. 10/11/14   Richard Hulen Shouts., MD  amLODipine-benazepril (LOTREL) 5-20 MG capsule Take 1 capsule by mouth daily. 02/07/15   Lorie Phenix, MD  amphetamine-dextroamphetamine (ADDERALL) 10 MG tablet Take 1 tablet (10 mg total) by mouth daily. 07/16/15   Richard Hulen Shouts., MD  aspirin 81 MG tablet Take by mouth. 04/21/10   Historical Provider, MD  finasteride (PROSCAR) 5 MG tablet take 1/2 tablet by mouth EVERY OTHER DAY 06/10/15   Maple Hudson., MD  Inulin [facility] CLEAR & NATURAL) POWD Take by mouth. 04/21/10   Historical Provider, MD  Multiple Vitamins-Minerals (CENTRUM SILVER PO) Take by mouth.    Historical Provider, MD  Omega-3 Fatty Acids (FISH OIL) 1200 MG CAPS Take by mouth. 04/21/10   Historical Provider, MD  simvastatin (ZOCOR) 20 MG tablet Take 1 tablet (20 mg total) by mouth at bedtime. 10/11/14   Richard Hulen Shouts., MD    Patient Active Problem List   Diagnosis Date Noted  . ADD (attention deficit disorder) 09/21/2014  .  Elevated blood sugar 09/21/2014  . Gout 09/21/2014  . Hypercholesteremia 09/21/2014  . Adiposity 09/21/2014  . Apnea, sleep 09/21/2014  . Alopecia 06/17/2009  . Colon, diverticulosis 06/17/2009  . Essential (primary) hypertension 06/17/2009  . Avitaminosis D 06/17/2009    No past medical history on file.  Social History   Social History  . Marital Status: Married    Spouse Name: [name]  . Number of Children: 2  . Years of Education: 16   Occupational History  . financial consulting now    Social History Main Topics  . Smoking status: Never Smoker   . Smokeless tobacco: Never Used  . Alcohol Use: No  . Drug Use: No  . Sexual Activity: Yes   Other Topics Concern  . Not on file   Social History Narrative    No Known Allergies  Review of Systems  Constitutional: Negative.   Respiratory: Negative.   Cardiovascular: Negative.   Gastrointestinal: Negative.   Musculoskeletal: Negative.   Psychiatric/Behavioral: Negative.     Immunization History  Administered Date(s) Administered  . Influenza,inj,Quad PF,36+ Mos 10/11/2014  . Tdap 07/01/2011   Objective:  BP 118/68 mmHg  Pulse 96  Temp(Src) 98.8 F (37.1 C)  Resp 14  Wt 203 lb (92.08 kg)  Physical Exam  Constitutional: He is oriented to person, place, and time and well-developed, well-nourished, and in no distress.  HENT:  Head: Normocephalic  and atraumatic.  Eyes: Conjunctivae are normal. Pupils are equal, round, and reactive to light.  Neck: Normal range of motion. Neck supple.  Cardiovascular: Normal rate, regular rhythm, normal heart sounds and intact distal pulses.   No murmur heard. Pulmonary/Chest: Effort normal and breath sounds normal. No respiratory distress. He has no wheezes.  Musculoskeletal: He exhibits no edema or tenderness.  Neurological: He is alert and oriented to person, place, and time. Gait normal.  Psychiatric: Mood, memory, affect and judgment normal.    Lab Results    Component Value Date   WBC 5.9 10/11/2014   HGB 14.1 09/27/2013   HCT 42.0 10/11/2014   PLT 339 10/11/2014   GLUCOSE 132* 10/11/2014   CHOL 166 10/11/2014   TRIG 98 10/11/2014   HDL 67 10/11/2014   LDLCALC 79 10/11/2014   TSH 1.440 10/11/2014   PSA 1.9 09/27/2013   HGBA1C 6.0 02/19/2015    CMP     Component Value Date/Time   NA 140 10/11/2014 0959   K 4.5 10/11/2014 0959   CL 100 10/11/2014 0959   CO2 24 10/11/2014 0959   GLUCOSE 132* 10/11/2014 0959   BUN 16 10/11/2014 0959   CREATININE 1.07 10/11/2014 0959   CREATININE 1.1 09/27/2013   CALCIUM 10.1 10/11/2014 0959   PROT 7.5 10/11/2014 0959   ALBUMIN 4.7 10/11/2014 0959   AST 21 10/11/2014 0959   ALT 30 10/11/2014 0959   ALKPHOS 72 10/11/2014 0959   BILITOT 0.5 10/11/2014 0959   GFRNONAA 76 10/11/2014 0959   GFRAA 87 10/11/2014 0959    Assessment and Plan :  1. Essential (primary) hypertension Stable. Refills given. - amLODipine-benazepril (LOTREL) 5-20 MG capsule; Take 1 capsule by mouth daily.  Dispense: 30 capsule; Refill: 12  2. Elevated blood sugar Check labs due to A1C machine down today. Continue working on habits. - HgB A1c  3. ADD (attention deficit disorder) Stable. Advised patient to try and not take medication during the weekend. Refills x 3 provided. - amphetamine-dextroamphetamine (ADDERALL) 10 MG tablet; Take 1 tablet (10 mg total) by mouth daily.  Dispense: 30 tablet; Refill: 0 - amphetamine-dextroamphetamine (ADDERALL) 10 MG tablet; Take 1 tablet (10 mg total) by mouth daily.  Dispense: 30 tablet; Refill: 0  4. Hypercholesteremia Refills given, check labs on the next visit. - simvastatin (ZOCOR) 20 MG tablet; Take 1 tablet (20 mg total) by mouth at bedtime.  Dispense: 30 tablet; Refill: 12  5. Adiposity Continue working on habits.  Patient was seen and examined by Dr. Bosie Closichard L Gilbert and note was scribed by Samara DeistAnastasiya Aleksandrova, RMA.   I have done the exam and reviewed the above  chart and it is accurate to the best of my knowledge.   Julieanne Mansonichard Gilbert MD [facility] 07/23/2015 8:18 AM

## 2015-07-24 LAB — HEMOGLOBIN A1C
ESTIMATED AVERAGE GLUCOSE: 126 mg/dL
HEMOGLOBIN A1C: 6 % — AB (ref 4.8–5.6)

## 2015-10-16 ENCOUNTER — Ambulatory Visit (INDEPENDENT_AMBULATORY_CARE_PROVIDER_SITE_OTHER): Payer: BC Managed Care – PPO | Admitting: Family Medicine

## 2015-10-16 ENCOUNTER — Encounter: Payer: Self-pay | Admitting: Family Medicine

## 2015-10-16 VITALS — BP 112/68 | HR 98 | Temp 98.2°F | Resp 16 | Ht 67.0 in | Wt 201.0 lb

## 2015-10-16 DIAGNOSIS — F909 Attention-deficit hyperactivity disorder, unspecified type: Secondary | ICD-10-CM

## 2015-10-16 DIAGNOSIS — E7800 Pure hypercholesterolemia, unspecified: Secondary | ICD-10-CM

## 2015-10-16 DIAGNOSIS — R739 Hyperglycemia, unspecified: Secondary | ICD-10-CM | POA: Diagnosis not present

## 2015-10-16 DIAGNOSIS — Z0000 Encounter for general adult medical examination without abnormal findings: Secondary | ICD-10-CM | POA: Diagnosis not present

## 2015-10-16 DIAGNOSIS — Z23 Encounter for immunization: Secondary | ICD-10-CM

## 2015-10-16 DIAGNOSIS — Z1211 Encounter for screening for malignant neoplasm of colon: Secondary | ICD-10-CM

## 2015-10-16 DIAGNOSIS — M109 Gout, unspecified: Secondary | ICD-10-CM | POA: Diagnosis not present

## 2015-10-16 DIAGNOSIS — Z125 Encounter for screening for malignant neoplasm of prostate: Secondary | ICD-10-CM

## 2015-10-16 DIAGNOSIS — F988 Other specified behavioral and emotional disorders with onset usually occurring in childhood and adolescence: Secondary | ICD-10-CM

## 2015-10-16 LAB — POCT URINALYSIS DIPSTICK
BILIRUBIN UA: NEGATIVE
Blood, UA: NEGATIVE
GLUCOSE UA: NEGATIVE
Ketones, UA: NEGATIVE
LEUKOCYTES UA: NEGATIVE
NITRITE UA: NEGATIVE
Protein, UA: NEGATIVE
Spec Grav, UA: 1.01
Urobilinogen, UA: 0.2
pH, UA: 7

## 2015-10-16 LAB — IFOBT (OCCULT BLOOD): IFOBT: NEGATIVE

## 2015-10-16 MED ORDER — AMPHETAMINE-DEXTROAMPHETAMINE 10 MG PO TABS: 10.0000 mg | ORAL_TABLET | Freq: Every day | ORAL | 0 refills | Status: DC

## 2015-10-16 MED ORDER — SIMVASTATIN 20 MG PO TABS: 20.0000 mg | ORAL_TABLET | Freq: Every day | ORAL | 12 refills | Status: DC

## 2015-10-16 MED ORDER — ALLOPURINOL 300 MG PO TABS: 300.0000 mg | ORAL_TABLET | Freq: Every day | ORAL | 12 refills | Status: DC

## 2015-10-16 NOTE — Progress Notes (Signed)
Patient: Antonio Whitney, Male    DOB: 1956/02/02, 60 y.o.   MRN: [id number] Visit Date: 10/16/2015  Today's Provider: Megan Mansichard Gilbert Jr, MD   Chief Complaint  Patient presents with  . Annual Exam   Subjective:    Annual physical exam Antonio Whitney is a 60 y.o. male who presents today for health maintenance and complete physical. He feels well. He reports exercising 5-7 days a week. He reports he is sleeping well. Patient feels well. He is up to exercising almost daily. He is trying to continue to work on diet. ----------------------------------------------------------------- Colonoscopy- 12/21/05 Diverticulosis repeat 10 years  Immunization History  Administered Date(s) Administered  . Influenza,inj,Quad PF,36+ Mos 10/11/2014  . Tdap 07/01/2011   Pt will need refills on Simvastatin, Allopurinol and Adderall.    Review of Systems  Constitutional: Negative.   HENT: Negative.   Eyes: Negative.   Respiratory: Positive for apnea.   Cardiovascular: Negative.   Gastrointestinal: Negative.   Endocrine: Negative.   Genitourinary: Negative.   Musculoskeletal: Negative.   Skin: Negative.   Allergic/Immunologic: Negative.   Neurological: Negative.   Hematological: Negative.   Psychiatric/Behavioral: Negative.     Social History      He  reports that he has never smoked. He has never used smokeless tobacco. He reports that he does not drink alcohol or use drugs.       Social History   Social History  . Marital status: Married    Spouse name: Rosalita ChessmanSuzanne  . Number of children: 2  . Years of education: 16   Occupational History  . financial consulting now    Social History Main Topics  . Smoking status: Never Smoker  . Smokeless tobacco: Never Used  . Alcohol use No  . Drug use: No  . Sexual activity: Yes   Other Topics Concern  . None   Social History Narrative  . None    History reviewed. No pertinent past medical history.   Patient Active Problem  List   Diagnosis Date Noted  . ADD (attention deficit disorder) 09/21/2014  . Elevated blood sugar 09/21/2014  . Gout 09/21/2014  . Hypercholesteremia 09/21/2014  . Adiposity 09/21/2014  . Apnea, sleep 09/21/2014  . Alopecia 06/17/2009  . Colon, diverticulosis 06/17/2009  . Essential (primary) hypertension 06/17/2009  . Avitaminosis D 06/17/2009    Past Surgical History:  Procedure Laterality Date  . APPENDECTOMY    . HERNIA REPAIR     inguinal left-1980    Family History        Family Status  Relation Status  . Mother Deceased at age 60  . Father Deceased at age 60   laryngeal cancer  . Brother Alive        His family history includes Breast cancer in his mother; Lung cancer in his father.    No Known Allergies  Current Meds  Medication Sig  . allopurinol (ZYLOPRIM) 300 MG tablet Take 1 tablet (300 mg total) by mouth daily.  Marland Kitchen. amLODipine-benazepril (LOTREL) 5-20 MG capsule Take 1 capsule by mouth daily.  Marland Kitchen. amphetamine-dextroamphetamine (ADDERALL) 10 MG tablet Take 1 tablet (10 mg total) by mouth daily.  Marland Kitchen. amphetamine-dextroamphetamine (ADDERALL) 10 MG tablet Take 1 tablet (10 mg total) by mouth daily.  Marland Kitchen. aspirin 81 MG tablet Take by mouth.  . finasteride (PROSCAR) 5 MG tablet take 1/2 tablet by mouth EVERY OTHER DAY  . Inulin (METAMUCIL CLEAR & NATURAL) POWD Take by mouth.  . Multiple  Vitamins-Minerals (CENTRUM SILVER PO) Take by mouth.  . Omega-3 Fatty Acids (FISH OIL) 1200 MG CAPS Take by mouth.  . simvastatin (ZOCOR) 20 MG tablet Take 1 tablet (20 mg total) by mouth at bedtime.    Patient Care Team: Maple Hudson., MD as PCP - General (Family Medicine)     Objective:   Vitals: There were no vitals taken for this visit.   Physical Exam  Constitutional: He is oriented to person, place, and time. He appears well-developed and well-nourished.  HENT:  Head: Normocephalic and atraumatic.  Right Ear: External ear normal.  Left Ear: External ear normal.   Nose: Nose normal.  Mouth/Throat: Oropharynx is clear and moist.  Eyes: Conjunctivae and EOM are normal. Pupils are equal, round, and reactive to light.  Neck: Normal range of motion. Neck supple.  Cardiovascular: Normal rate, regular rhythm, normal heart sounds and intact distal pulses.   Pulmonary/Chest: Effort normal and breath sounds normal.  Abdominal: Soft. Bowel sounds are normal.  Musculoskeletal: Normal range of motion.  Neurological: He is alert and oriented to person, place, and time. He has normal reflexes.  Skin: Skin is warm and dry.  Psychiatric: He has a normal mood and affect. His behavior is normal. Judgment and thought content normal.     Depression Screen PHQ 2/9 Scores 10/11/2014  PHQ - 2 Score 0      Assessment & Plan:     Routine Health Maintenance and Physical Exam  Exercise Activities and Dietary recommendations Goals    None      Immunization History  Administered Date(s) Administered  . Influenza,inj,Quad PF,36+ Mos 10/11/2014  . Tdap 07/01/2011    Health Maintenance  Topic Date Due  . Hepatitis C Screening  December 20, 1955  . HIV Screening  08/04/1970  . COLONOSCOPY  08/03/2005  . ZOSTAVAX  08/04/2015  . INFLUENZA VACCINE  09/10/2015  . TETANUS/TDAP  06/30/2021    zostavax today.  Refer for screening colonoscopy. Last doneJune  2007 Discussed health benefits of physical activity, and encouraged him to engage in regular exercise appropriate for his age and condition.              Obstructive sleep apnea-patient wears CPAP nightly             ADD-refill Adderall 3             Hypertension-controlled.              Prediabetes I have done the exam and reviewed the above chart and it is accurate to the best of my knowledge.  --------------------------------------------------------------------    [name], MD  [facility]

## 2015-10-17 ENCOUNTER — Telehealth: Payer: Self-pay

## 2015-10-17 LAB — HEMOGLOBIN A1C
ESTIMATED AVERAGE GLUCOSE: 120 mg/dL
Hgb A1c MFr Bld: 5.8 % — ABNORMAL HIGH (ref 4.8–5.6)

## 2015-10-17 LAB — LIPID PANEL WITH LDL/HDL RATIO
CHOLESTEROL TOTAL: 156 mg/dL (ref 100–199)
HDL: 79 mg/dL (ref >39)
LDL Calculated: 61 mg/dL (ref 0–99)
LDl/HDL Ratio: 0.8 ratio units (ref 0.0–3.6)
Triglycerides: 82 mg/dL (ref 0–149)
VLDL CHOLESTEROL CAL: 16 mg/dL (ref 5–40)

## 2015-10-17 LAB — COMPREHENSIVE METABOLIC PANEL
ALBUMIN: 4.7 g/dL (ref 3.6–4.8)
ALT: 29 IU/L (ref 0–44)
AST: 20 IU/L (ref 0–40)
Albumin/Globulin Ratio: 1.7 (ref 1.2–2.2)
Alkaline Phosphatase: 73 IU/L (ref 39–117)
BUN / CREAT RATIO: 20 (ref 10–24)
BUN: 21 mg/dL (ref 8–27)
Bilirubin Total: 0.8 mg/dL (ref 0.0–1.2)
CALCIUM: 9.9 mg/dL (ref 8.6–10.2)
CO2: 25 mmol/L (ref 18–29)
CREATININE: 1.07 mg/dL (ref 0.76–1.27)
Chloride: 101 mmol/L (ref 96–106)
GFR calc Af Amer: 87 mL/min/{1.73_m2} (ref >59)
GFR, EST NON AFRICAN AMERICAN: 75 mL/min/{1.73_m2} (ref >59)
GLOBULIN, TOTAL: 2.8 g/dL (ref 1.5–4.5)
Glucose: 126 mg/dL — ABNORMAL HIGH (ref 65–99)
Potassium: 4.5 mmol/L (ref 3.5–5.2)
SODIUM: 141 mmol/L (ref 134–144)
TOTAL PROTEIN: 7.5 g/dL (ref 6.0–8.5)

## 2015-10-17 LAB — CBC WITH DIFFERENTIAL/PLATELET
BASOS: 0 %
Basophils Absolute: 0 10*3/uL (ref 0.0–0.2)
EOS (ABSOLUTE): 0.1 10*3/uL (ref 0.0–0.4)
EOS: 1 %
HEMATOCRIT: 41 % (ref 37.5–51.0)
HEMOGLOBIN: 14.1 g/dL (ref 12.6–17.7)
IMMATURE GRANULOCYTES: 0 %
Immature Grans (Abs): 0 10*3/uL (ref 0.0–0.1)
LYMPHS ABS: 1.3 10*3/uL (ref 0.7–3.1)
Lymphs: 27 %
MCH: 30.5 pg (ref 26.6–33.0)
MCHC: 34.4 g/dL (ref 31.5–35.7)
MCV: 89 fL (ref 79–97)
MONOCYTES: 10 %
Monocytes Absolute: 0.5 10*3/uL (ref 0.1–0.9)
Neutrophils Absolute: 3.1 10*3/uL (ref 1.4–7.0)
Neutrophils: 62 %
Platelets: 288 10*3/uL (ref 150–379)
RBC: 4.63 x10E6/uL (ref 4.14–5.80)
RDW: 14.1 % (ref 12.3–15.4)
WBC: 5 10*3/uL (ref 3.4–10.8)

## 2015-10-17 LAB — PSA: Prostate Specific Ag, Serum: 1.7 ng/mL (ref 0.0–4.0)

## 2015-10-17 LAB — TSH: TSH: 1.44 u[IU]/mL (ref 0.450–4.500)

## 2015-10-17 NOTE — Telephone Encounter (Signed)
-----   Message from Maple Hudsonichard L Gilbert Jr., MD sent at 10/17/2015  9:10 AM EDT ----- Labs all stable.

## 2015-10-17 NOTE — Telephone Encounter (Signed)
Pt advised.   Thanks,   -Unita Detamore  

## 2015-12-14 ENCOUNTER — Ambulatory Visit (INDEPENDENT_AMBULATORY_CARE_PROVIDER_SITE_OTHER): Payer: BC Managed Care – PPO

## 2015-12-14 DIAGNOSIS — Z23 Encounter for immunization: Secondary | ICD-10-CM | POA: Diagnosis not present

## 2016-02-11 ENCOUNTER — Telehealth: Payer: Self-pay | Admitting: Family Medicine

## 2016-02-11 ENCOUNTER — Other Ambulatory Visit: Payer: Self-pay

## 2016-02-11 DIAGNOSIS — F988 Other specified behavioral and emotional disorders with onset usually occurring in childhood and adolescence: Secondary | ICD-10-CM

## 2016-02-11 MED ORDER — AMPHETAMINE-DEXTROAMPHETAMINE 10 MG PO TABS: 10.0000 mg | ORAL_TABLET | Freq: Every day | ORAL | 0 refills | Status: DC

## 2016-02-11 NOTE — Telephone Encounter (Signed)
1 month ok

## 2016-02-11 NOTE — Telephone Encounter (Signed)
Pt contacted office for refill request on the following medications:  amphetamine-dextroamphetamine (ADDERALL) 10 MG tablet.  CB#[id number]/MW

## 2016-02-11 NOTE — Telephone Encounter (Signed)
Please review  ED 

## 2016-03-02 LAB — HM COLONOSCOPY

## 2016-03-11 ENCOUNTER — Other Ambulatory Visit: Payer: Self-pay | Admitting: Family Medicine

## 2016-03-11 DIAGNOSIS — F988 Other specified behavioral and emotional disorders with onset usually occurring in childhood and adolescence: Secondary | ICD-10-CM

## 2016-03-11 MED ORDER — AMPHETAMINE-DEXTROAMPHETAMINE 10 MG PO TABS: 10.0000 mg | ORAL_TABLET | Freq: Every day | ORAL | 0 refills | Status: DC

## 2016-03-11 NOTE — Telephone Encounter (Signed)
Please review. Ok to refill?  

## 2016-03-11 NOTE — Telephone Encounter (Signed)
Pt contacted office for refill request on the following medications: amphetamine-dextroamphetamine (ADDERALL) 10 MG tablet Last Rx: 02/11/16 for 1 month pt is requesting 3 months of Rx. Pt also stated that he had his CPE in Sept of 2017 and wasn't sure if he needed to schedule a F/U or if he was good to come Sept 2018 for his next CPE. Please advise. Thanks TNP

## 2016-03-11 NOTE — Telephone Encounter (Signed)
RX printed at front desk for pickup. Follow up appointment scheduled. Patient is aware.

## 2016-03-11 NOTE — Telephone Encounter (Signed)
He can have one month. I can see him back in early March for the 3 month period. That is a DEA/federal law issue.

## 2016-03-25 DIAGNOSIS — K5790 Diverticulosis of intestine, part unspecified, without perforation or abscess without bleeding: Secondary | ICD-10-CM

## 2016-03-25 HISTORY — DX: Diverticulosis of intestine, part unspecified, without perforation or abscess without bleeding: K57.90

## 2016-04-14 ENCOUNTER — Ambulatory Visit (INDEPENDENT_AMBULATORY_CARE_PROVIDER_SITE_OTHER): Payer: BC Managed Care – PPO | Admitting: Family Medicine

## 2016-04-14 VITALS — BP 120/72 | HR 100 | Temp 98.4°F | Resp 16 | Wt 205.0 lb

## 2016-04-14 DIAGNOSIS — E7800 Pure hypercholesterolemia, unspecified: Secondary | ICD-10-CM

## 2016-04-14 DIAGNOSIS — M109 Gout, unspecified: Secondary | ICD-10-CM

## 2016-04-14 DIAGNOSIS — F988 Other specified behavioral and emotional disorders with onset usually occurring in childhood and adolescence: Secondary | ICD-10-CM

## 2016-04-14 DIAGNOSIS — E119 Type 2 diabetes mellitus without complications: Secondary | ICD-10-CM | POA: Diagnosis not present

## 2016-04-14 DIAGNOSIS — I10 Essential (primary) hypertension: Secondary | ICD-10-CM

## 2016-04-14 DIAGNOSIS — Z6832 Body mass index (BMI) 32.0-32.9, adult: Secondary | ICD-10-CM

## 2016-04-14 LAB — POCT GLYCOSYLATED HEMOGLOBIN (HGB A1C): HEMOGLOBIN A1C: 6.5

## 2016-04-14 LAB — POCT UA - MICROALBUMIN: Microalbumin Ur, POC: 20 mg/L

## 2016-04-14 MED ORDER — AMPHETAMINE-DEXTROAMPHETAMINE 10 MG PO TABS: 10.0000 mg | ORAL_TABLET | Freq: Every day | ORAL | 0 refills | Status: DC

## 2016-04-14 MED ORDER — AMLODIPINE BESY-BENAZEPRIL HCL 5-20 MG PO CAPS: 1.0000 | ORAL_CAPSULE | Freq: Every day | ORAL | 12 refills | Status: DC

## 2016-04-14 MED ORDER — ALLOPURINOL 300 MG PO TABS: 300.0000 mg | ORAL_TABLET | Freq: Every day | ORAL | 12 refills | Status: DC

## 2016-04-14 MED ORDER — FINASTERIDE 5 MG PO TABS: 2.5000 mg | ORAL_TABLET | ORAL | 12 refills | Status: DC

## 2016-04-14 MED ORDER — SIMVASTATIN 20 MG PO TABS: 20.0000 mg | ORAL_TABLET | Freq: Every day | ORAL | 12 refills | Status: DC

## 2016-04-14 NOTE — Progress Notes (Signed)
Antonio Whitney  MRN: [id number] DOB: 07/08/1955  Subjective:  HPI  Patient is here for follow up. LOV was in September 2017 for CPE. Patient does not check his sugar at home. Patient does not check his b/p at home. NO cardiac symptoms. He is taking Adderall 1 tablet every day and needs refill on that today. All labs routine were done on 10/16/15. Lab Results  Component Value Date   HGBA1C 5.8 (H) 10/16/2015   BP Readings from Last 3 Encounters:  04/14/16 120/72  10/16/15 112/68  07/23/15 118/68   Patient Active Problem List   Diagnosis Date Noted  . ADD (attention deficit disorder) 09/21/2014  . Elevated blood sugar 09/21/2014  . Gout 09/21/2014  . Hypercholesteremia 09/21/2014  . Adiposity 09/21/2014  . Apnea, sleep 09/21/2014  . Alopecia 06/17/2009  . Colon, diverticulosis 06/17/2009  . Essential (primary) hypertension 06/17/2009  . Avitaminosis D 06/17/2009    No past medical history on file.  Social History   Social History  . Marital status: Married    Spouse name: [name]  . Number of children: 2  . Years of education: 16   Occupational History  . financial consulting now    Social History Main Topics  . Smoking status: Never Smoker  . Smokeless tobacco: Never Used  . Alcohol use No  . Drug use: No  . Sexual activity: Yes   Other Topics Concern  . Not on file   Social History Narrative  . No narrative on file    Outpatient Encounter Prescriptions as of 04/14/2016  Medication Sig Note  . allopurinol (ZYLOPRIM) 300 MG tablet Take 1 tablet (300 mg total) by mouth daily.   Marland Kitchen amLODipine-benazepril (LOTREL) 5-20 MG capsule Take 1 capsule by mouth daily.   Marland Kitchen amphetamine-dextroamphetamine (ADDERALL) 10 MG tablet Take 1 tablet (10 mg total) by mouth daily.   Marland Kitchen amphetamine-dextroamphetamine (ADDERALL) 10 MG tablet Take 1 tablet (10 mg total) by mouth daily.   . finasteride (PROSCAR) 5 MG tablet take 1/2 tablet by mouth EVERY OTHER DAY   . Inulin  (METAMUCIL CLEAR & NATURAL) POWD Take by mouth. 09/21/2014: Received from: [facility]  . Multiple Vitamins-Minerals (CENTRUM SILVER PO) Take by mouth. 09/21/2014: Received from: [facility]  . Omega-3 Fatty Acids (FISH OIL) 1200 MG CAPS Take by mouth. 09/21/2014: Received from: [facility]  . simvastatin (ZOCOR) 20 MG tablet Take 1 tablet (20 mg total) by mouth at bedtime.   Marland Kitchen aspirin 81 MG tablet Take by mouth. 04/14/2016: Not right now   No facility-administered encounter medications on file as of 04/14/2016.     No Known Allergies  Review of Systems  Constitutional: Negative.   Respiratory: Positive for cough.   Cardiovascular: Negative.   Musculoskeletal: Negative.   Neurological: Negative.   Psychiatric/Behavioral: Negative.        Symptoms are stable on medication    Objective:  BP 120/72   Pulse 100   Temp 98.4 F (36.9 C)   Resp 16   Wt 205 lb (93 kg)   BMI 32.11 kg/m   Physical Exam  Constitutional: He is oriented to person, place, and time and well-developed, well-nourished, and in no distress.  HENT:  Head: Normocephalic and atraumatic.  Right Ear: External ear normal.  Left Ear: External ear normal.  Nose: Nose normal.  Eyes: Conjunctivae are normal. Pupils are equal, round, and reactive to light.  Neck: Normal range of motion. Neck supple. No thyromegaly present.  Cardiovascular: Normal rate, regular rhythm and intact distal pulses.   No murmur heard. Pulmonary/Chest: Effort normal and breath sounds normal. No respiratory distress. He has no wheezes.  Abdominal: Soft.  Musculoskeletal: He exhibits no edema or tenderness.  Neurological: He is alert and oriented to person, place, and time.  Skin: Skin is warm and dry.  Psychiatric: Mood, memory, affect and judgment normal.    Assessment and Plan :  1. Attention deficit disorder, unspecified hyperactivity presence Refills x 3. Stable. -  amphetamine-dextroamphetamine (ADDERALL) 10 MG tablet; Take 1 tablet (10 mg total) by mouth daily.  Dispense: 30 tablet; Refill: 0 - amphetamine-dextroamphetamine (ADDERALL) 10 MG tablet; Take 1 tablet (10 mg total) by mouth daily.  Dispense: 30 tablet; Refill: 0  2. Hypercholesteremia Stable on last check. Refill given. - simvastatin (ZOCOR) 20 MG tablet; Take 1 tablet (20 mg total) by mouth at bedtime.  Dispense: 30 tablet; Refill: 12  3. Type 2 diabetes mellitus without complication, without long-term current use of insulin (HCC) A1C 6.5, worse. Patient is now diabetic. Foot exam normal today. Patient is aware of this, discussed with patient working on habits, may need to start medication on the next visit if sugar is not better. Re check on the next visit. Consider Metformin at this time. - POCT HgB A1C  4. Essential (primary) hypertension Stable. Continue current medication. - amLODipine-benazepril (LOTREL) 5-20 MG capsule; Take 1 capsule by mouth daily.  Dispense: 30 capsule; Refill: 12  5. Gout, unspecified cause, unspecified chronicity, unspecified site Refill given. Stable. - allopurinol (ZYLOPRIM) 300 MG tablet; Take 1 tablet (300 mg total) by mouth daily.  Dispense: 30 tablet; Refill: 12  6. BMI 32.0-32.9,adult Work on habits, discussed this at length.  HPI, Exam and A&P transcribed under direction and in the presence of Julieanne Mansonichard Gilbert, MD. I have reviewed the health advisors note, was  available for consultation and I agree with documentation and plan. Julieanne Mansonichard Gilbert MD [facility]

## 2016-04-24 ENCOUNTER — Encounter: Payer: Self-pay | Admitting: Family Medicine

## 2016-04-24 LAB — HM DIABETES EYE EXAM

## 2016-07-28 ENCOUNTER — Ambulatory Visit (INDEPENDENT_AMBULATORY_CARE_PROVIDER_SITE_OTHER): Payer: BC Managed Care – PPO | Admitting: Family Medicine

## 2016-07-28 VITALS — BP 112/62 | HR 102 | Resp 16 | Wt 203.0 lb

## 2016-07-28 DIAGNOSIS — R739 Hyperglycemia, unspecified: Secondary | ICD-10-CM

## 2016-07-28 DIAGNOSIS — M109 Gout, unspecified: Secondary | ICD-10-CM | POA: Diagnosis not present

## 2016-07-28 DIAGNOSIS — F988 Other specified behavioral and emotional disorders with onset usually occurring in childhood and adolescence: Secondary | ICD-10-CM

## 2016-07-28 DIAGNOSIS — I10 Essential (primary) hypertension: Secondary | ICD-10-CM | POA: Diagnosis not present

## 2016-07-28 DIAGNOSIS — G4739 Other sleep apnea: Secondary | ICD-10-CM

## 2016-07-28 DIAGNOSIS — E7800 Pure hypercholesterolemia, unspecified: Secondary | ICD-10-CM | POA: Diagnosis not present

## 2016-07-28 LAB — POCT GLYCOSYLATED HEMOGLOBIN (HGB A1C): Hemoglobin A1C: 6.2

## 2016-07-28 MED ORDER — AMPHETAMINE-DEXTROAMPHETAMINE 10 MG PO TABS: 10.0000 mg | ORAL_TABLET | Freq: Every day | ORAL | 0 refills | Status: DC

## 2016-07-28 MED ORDER — SIMVASTATIN 20 MG PO TABS: 20.0000 mg | ORAL_TABLET | Freq: Every day | ORAL | 12 refills | Status: DC

## 2016-07-28 MED ORDER — ALLOPURINOL 300 MG PO TABS: 300.0000 mg | ORAL_TABLET | Freq: Every day | ORAL | 12 refills | Status: DC

## 2016-07-28 MED ORDER — AMLODIPINE BESY-BENAZEPRIL HCL 5-20 MG PO CAPS: 1.0000 | ORAL_CAPSULE | Freq: Every day | ORAL | 12 refills | Status: DC

## 2016-07-28 NOTE — Progress Notes (Signed)
Antonio GravelDonald D Whitney  MRN: [id number] DOB: December 18, 1955  Subjective:  HPI   Patient is here for 3 months follow up. Last office visit was on 04/24/16. DM: patient is not checking his sugar at home. He has been working on eating better and been active like spinning, walking and working in the yard. No numbness or tingling sensation present. Lab Results  Component Value Date   HGBA1C 6.5 04/14/2016   Wt Readings from Last 3 Encounters:  07/28/16 203 lb (92.1 kg)  04/14/16 205 lb (93 kg)  10/16/15 201 lb (91.2 kg)   Routine lab work was done on 10/16/15-it was stable. B/P: patient is not checking his b/p at home. No chest pain or tightness. BP Readings from Last 3 Encounters:  07/28/16 112/62  04/14/16 120/72  10/16/15 112/68  ADD: patient needs refill on Adderall. Symptoms are stable on the medication. He is taking medication every day. Sleep apnea: patient uses CPAP every night.  Patient states he is doing well the only thing he has noticed in the past week he noticed some dizziness in the morning when he wakes up but lasts for about 2 seconds and goes away., not severe and patient is not alarmed at this time. Patient Active Problem List   Diagnosis Date Noted  . ADD (attention deficit disorder) 09/21/2014  . Elevated blood sugar 09/21/2014  . Gout 09/21/2014  . Hypercholesteremia 09/21/2014  . Adiposity 09/21/2014  . Apnea, sleep 09/21/2014  . Alopecia 06/17/2009  . Colon, diverticulosis 06/17/2009  . Essential (primary) hypertension 06/17/2009  . Avitaminosis D 06/17/2009    No past medical history on file.  Social History   Social History  . Marital status: Married    Spouse name: Rosalita ChessmanSuzanne  . Number of children: 2  . Years of education: 16   Occupational History  . financial consulting now    Social History Main Topics  . Smoking status: Never Smoker  . Smokeless tobacco: Never Used  . Alcohol use No  . Drug use: No  . Sexual activity: Yes   Other Topics Concern   . Not on file   Social History Narrative  . No narrative on file    Outpatient Encounter Prescriptions as of 07/28/2016  Medication Sig Note  . allopurinol (ZYLOPRIM) 300 MG tablet Take 1 tablet (300 mg total) by mouth daily.   Marland Kitchen. amLODipine-benazepril (LOTREL) 5-20 MG capsule Take 1 capsule by mouth daily.   Marland Kitchen. amphetamine-dextroamphetamine (ADDERALL) 10 MG tablet Take 1 tablet (10 mg total) by mouth daily.   Marland Kitchen. amphetamine-dextroamphetamine (ADDERALL) 10 MG tablet Take 1 tablet (10 mg total) by mouth daily.   Marland Kitchen. aspirin 81 MG tablet Take by mouth. 04/14/2016: Not right now  . finasteride (PROSCAR) 5 MG tablet Take 0.5 tablets (2.5 mg total) by mouth every other day.   . Inulin (METAMUCIL CLEAR & NATURAL) POWD Take by mouth. 09/21/2014: [facility]  . Multiple Vitamins-Minerals (CENTRUM SILVER PO) Take by mouth. 09/21/2014: [facility]ceived [facility]: [facility]  . Omega-3 Fatty Acids (FISH OIL) 1200 MG CAPS Take by mouth. 09/21/2014: [facility]  . simvastatin (ZOCOR) 20 MG tablet Take 1 tablet (20 mg total) by mouth at bedtime.    No facility-administered encounter medications on file as of 07/28/2016.     No Known Allergies  Review of Systems  Constitutional: Negative.   Respiratory: Negative.   Cardiovascular: Negative.   Gastrointestinal: Negative.   Musculoskeletal: Negative.   Neurological: Positive  for dizziness (for a little bit in the morning.).    Objective:  BP 112/62   Pulse (!) 102   Resp 16   Wt 203 lb (92.1 kg)   BMI 31.79 kg/m   Physical Exam  Constitutional: He is oriented to person, place, and time and well-developed, well-nourished, and in no distress.  HENT:  Head: Normocephalic and atraumatic.  Right Ear: External ear normal.  Left Ear: External ear normal.  Nose: Nose normal.  Eyes: Conjunctivae are normal. Pupils are equal, round, and reactive to light. No scleral icterus.  Neck:  Normal range of motion. Neck supple. No thyromegaly present.  Cardiovascular: Normal rate, regular rhythm, normal heart sounds and intact distal pulses.  Exam reveals no gallop.   No murmur heard. Pulmonary/Chest: Effort normal and breath sounds normal. No respiratory distress. He has no wheezes.  Abdominal: Soft.  Musculoskeletal: He exhibits no edema or tenderness.  Neurological: He is alert and oriented to person, place, and time. Gait normal. GCS score is 15.  Skin: Skin is warm and dry.  Psychiatric: Mood, memory, affect and judgment normal.    Assessment and Plan :  1. Attention deficit disorder, unspecified hyperactivity presence Stable. Refills x 3. - amphetamine-dextroamphetamine (ADDERALL) 10 MG tablet; Take 1 tablet (10 mg total) by mouth daily.  Dispense: 30 tablet; Refill: 0 - amphetamine-dextroamphetamine (ADDERALL) 10 MG tablet; Take 1 tablet (10 mg total) by mouth daily.  Dispense: 30 tablet; Refill: 0  2. Essential (primary) hypertension Stable. Refill given. - amLODipine-benazepril (LOTREL) 5-20 MG capsule; Take 1 capsule by mouth daily.  Dispense: 30 capsule; Refill: 12  3. Gout, unspecified cause, unspecified chronicity, unspecified site Stable. Refill given. - allopurinol (ZYLOPRIM) 300 MG tablet; Take 1 tablet (300 mg total) by mouth daily.  Dispense: 30 tablet; Refill: 12  4. Hypercholesteremia Refills given. - simvastatin (ZOCOR) 20 MG tablet; Take 1 tablet (20 mg total) by mouth at bedtime.  Dispense: 30 tablet; Refill: 12  5. Elevated blood sugar A1C 6.2. Better. Continue working on habits. - POCT HgB A1C  6. Other sleep apnea Stable on CPAP every night. Tolerating this well. Advised patient to let us know if dizzy spells become worse and may need further work up. Follow as needed.  HPI, Exam and A&P transcribed by Samara Deist, RMA under direction and in the presence of Julieanne Manson, MD.

## 2016-10-13 ENCOUNTER — Encounter: Payer: BC Managed Care – PPO | Admitting: Family Medicine

## 2016-10-21 ENCOUNTER — Encounter: Payer: BC Managed Care – PPO | Admitting: Family Medicine

## 2016-10-22 ENCOUNTER — Ambulatory Visit (INDEPENDENT_AMBULATORY_CARE_PROVIDER_SITE_OTHER): Payer: BC Managed Care – PPO | Admitting: Family Medicine

## 2016-10-22 ENCOUNTER — Encounter: Payer: Self-pay | Admitting: Family Medicine

## 2016-10-22 VITALS — BP 102/66 | HR 100 | Temp 98.0°F | Resp 14 | Ht 67.0 in | Wt 206.0 lb

## 2016-10-22 DIAGNOSIS — E119 Type 2 diabetes mellitus without complications: Secondary | ICD-10-CM | POA: Diagnosis not present

## 2016-10-22 DIAGNOSIS — F988 Other specified behavioral and emotional disorders with onset usually occurring in childhood and adolescence: Secondary | ICD-10-CM

## 2016-10-22 DIAGNOSIS — Z0000 Encounter for general adult medical examination without abnormal findings: Secondary | ICD-10-CM

## 2016-10-22 DIAGNOSIS — Z23 Encounter for immunization: Secondary | ICD-10-CM

## 2016-10-22 DIAGNOSIS — Z1211 Encounter for screening for malignant neoplasm of colon: Secondary | ICD-10-CM | POA: Diagnosis not present

## 2016-10-22 DIAGNOSIS — Z125 Encounter for screening for malignant neoplasm of prostate: Secondary | ICD-10-CM

## 2016-10-22 MED ORDER — AMPHETAMINE-DEXTROAMPHETAMINE 10 MG PO TABS: 10.0000 mg | ORAL_TABLET | Freq: Every day | ORAL | 0 refills | Status: DC

## 2016-10-22 NOTE — Progress Notes (Signed)
Patient: Antonio Whitney, Male    DOB: 01-01-1956, 61 y.o.   MRN: [id number] Visit Date: 10/22/2016  Today's Provider: Megan Mansichard Gilbert Jr, MD   Chief Complaint  Patient presents with  . Annual Exam   Subjective:  Antonio GravelDonald D Ferrall is a 61 y.o. male who presents today for health maintenance and complete physical. He feels well. He reports exercising spin in the mornings for 30 minutes, walking in the evening, yard work. He reports he is sleeping fairly well using CPAP at night. Immunization History  Administered Date(s) Administered  . Influenza,inj,Quad PF,6+ Mos 10/11/2014, 12/14/2015  . Tdap 07/01/2011  . Zoster 10/16/2015   Last colonoscopy was 03/02/16 tubular adenoma, repeat in 5 years.  Review of Systems  Constitutional: Negative.   HENT: Negative.   Eyes: Negative.   Respiratory: Positive for apnea.   Cardiovascular: Negative.   Gastrointestinal: Negative.   Endocrine: Negative.   Genitourinary: Negative.   Musculoskeletal: Negative.   Skin: Negative.   Allergic/Immunologic: Negative.   Neurological: Negative.   Hematological: Negative.   Psychiatric/Behavioral: Negative.     Social History   Social History  . Marital status: Married    Spouse name: Rosalita ChessmanSuzanne  . Number of children: 2  . Years of education: 16   Occupational History  . financial consulting now    Social History Main Topics  . Smoking status: Never Smoker  . Smokeless tobacco: Never Used  . Alcohol use No  . Drug use: No  . Sexual activity: Yes   Other Topics Concern  . Not on file   Social History Narrative  . No narrative on file    Patient Active Problem List   Diagnosis Date Noted  . ADD (attention deficit disorder) 09/21/2014  . Elevated blood sugar 09/21/2014  . Gout 09/21/2014  . Hypercholesteremia 09/21/2014  . Adiposity 09/21/2014  . Apnea, sleep 09/21/2014  . Alopecia 06/17/2009  . Colon, diverticulosis 06/17/2009  . Essential (primary) hypertension 06/17/2009  .  Avitaminosis D 06/17/2009    Past Surgical History:  Procedure Laterality Date  . APPENDECTOMY    . HERNIA REPAIR     inguinal left-1980    His family history includes Breast cancer in his mother; Lung cancer in his father.     Outpatient Encounter Prescriptions as of 10/22/2016  Medication Sig Note  . allopurinol (ZYLOPRIM) 300 MG tablet Take 1 tablet (300 mg total) by mouth daily.   Marland Kitchen. amLODipine-benazepril (LOTREL) 5-20 MG capsule Take 1 capsule by mouth daily.   Marland Kitchen. amphetamine-dextroamphetamine (ADDERALL) 10 MG tablet Take 1 tablet (10 mg total) by mouth daily.   Marland Kitchen. amphetamine-dextroamphetamine (ADDERALL) 10 MG tablet Take 1 tablet (10 mg total) by mouth daily.   Marland Kitchen. aspirin 81 MG tablet Take by mouth. 04/14/2016: Not right now  . finasteride (PROSCAR) 5 MG tablet Take 0.5 tablets (2.5 mg total) by mouth every other day.   . Inulin (METAMUCIL CLEAR & NATURAL) POWD Take by mouth. 09/21/2014: Received from: [facility]  . Multiple Vitamins-Minerals (CENTRUM SILVER PO) Take by mouth. 09/21/2014: Received from: [facility]  . NON FORMULARY CPAP at night   . Omega-3 Fatty Acids (FISH OIL) 1200 MG CAPS Take by mouth. 09/21/2014: Received from: [facility]  . simvastatin (ZOCOR) 20 MG tablet Take 1 tablet (20 mg total) by mouth at bedtime.   . [DISCONTINUED] amphetamine-dextroamphetamine (ADDERALL) 10 MG tablet Take 1 tablet (10 mg total) by mouth daily.   . [DISCONTINUED] amphetamine-dextroamphetamine (ADDERALL) 10 MG tablet  Take 1 tablet (10 mg total) by mouth daily.   . [DISCONTINUED] amphetamine-dextroamphetamine (ADDERALL) 10 MG tablet Take 1 tablet (10 mg total) by mouth daily.    No facility-administered encounter medications on file as of 10/22/2016.     Patient Care Team: Maple Hudson., MD as PCP - General (Family Medicine)      Objective:   Vitals:  Vitals:   10/22/16 1142  BP: 102/66  Pulse: 100  Resp: 14  Temp:  98 F (36.7 C)  Weight: 206 lb (93.4 kg)  Height:  (1.702 m)    Physical Exam  Constitutional: He is oriented to person, place, and time. He appears well-developed and well-nourished.  HENT:  Head: Normocephalic and atraumatic.  Right Ear: External ear normal.  Left Ear: External ear normal.  Nose: Nose normal.  Mouth/Throat: Oropharynx is clear and moist.  Eyes: Pupils are equal, round, and reactive to light. Conjunctivae are normal.  Neck: Normal range of motion. Neck supple.  Cardiovascular: Normal rate, regular rhythm, normal heart sounds and intact distal pulses.  Exam reveals no gallop.   No murmur heard. Pulmonary/Chest: Effort normal and breath sounds normal. No respiratory distress. He has no wheezes.  Abdominal: Soft. He exhibits no distension. There is no tenderness.  Genitourinary: Penis normal.  Musculoskeletal: He exhibits no edema or tenderness.  Neurological: He is alert and oriented to person, place, and time.  Skin: No rash noted. No erythema.  Psychiatric: He has a normal mood and affect. His behavior is normal. Judgment and thought content normal.   Depression Screen PHQ 2/9 Scores 10/22/2016 10/16/2015 10/11/2014  PHQ - 2 Score 0 0 0  PHQ- 9 Score 1 - -   Assessment & Plan:    1. Annual physical exam - CBC with Differential/Platelet - Comprehensive metabolic panel - Lipid Profile - TSH  2. Prostate cancer screening  - PSA  3. Colon cancer screening Defer DRE today since patient had colonoscopy in January 2018.  4. Type 2 diabetes mellitus without complication, without long-term current use of insulin (HCC) - HgB A1c  5. Attention deficit disorder, unspecified hyperactivity presence Refill given x 3 - amphetamine-dextroamphetamine (ADDERALL) 10 MG tablet; Take 1 tablet (10 mg total) by mouth daily.  Dispense: 30 tablet; Refill: 0 - amphetamine-dextroamphetamine (ADDERALL) 10 MG tablet; Take 1 tablet (10 mg total) by mouth daily.  Dispense: 30  tablet; Refill: 0  6. Need for immunization against influenza - Flu Vaccine QUAD 36+ mos IM  7. Need for 23-polyvalent pneumococcal polysaccharide vaccine - Pneumococcal polysaccharide vaccine 23-valent greater than or equal to 2yo subcutaneous/IM  HPI, Exam and A&P transcribed by Domingo Cocking, RMA under direction and in the presence of Julieanne Manson, MD.    Discussed health benefits of physical activity, and encouraged him to engage in regular exercise appropriate for his age and condition.   I have done the exam and reviewed the chart and it is accurate to the best of my knowledge. Dentist has been used and  any errors in dictation or transcription are unintentional. Julieanne Manson M.D. [facility]

## 2016-10-23 LAB — COMPLETE METABOLIC PANEL WITH GFR
AG Ratio: 1.6 (calc) (ref 1.0–2.5)
ALKALINE PHOSPHATASE (APISO): 65 U/L (ref 40–115)
ALT: 27 U/L (ref 9–46)
AST: 21 U/L (ref 10–35)
Albumin: 4.5 g/dL (ref 3.6–5.1)
BILIRUBIN TOTAL: 0.7 mg/dL (ref 0.2–1.2)
BUN: 19 mg/dL (ref 7–25)
CHLORIDE: 104 mmol/L (ref 98–110)
CO2: 25 mmol/L (ref 20–32)
CREATININE: 1.08 mg/dL (ref 0.70–1.25)
Calcium: 10 mg/dL (ref 8.6–10.3)
GFR, EST AFRICAN AMERICAN: 85 mL/min/{1.73_m2} (ref >=60)
GFR, Est Non African American: 74 mL/min/{1.73_m2} (ref >=60)
GLUCOSE: 122 mg/dL — AB (ref 65–99)
Globulin: 2.8 g/dL (calc) (ref 1.9–3.7)
Potassium: 4.6 mmol/L (ref 3.5–5.3)
Sodium: 138 mmol/L (ref 135–146)
TOTAL PROTEIN: 7.3 g/dL (ref 6.1–8.1)

## 2016-10-23 LAB — TSH: TSH: 1.46 mIU/L (ref 0.40–4.50)

## 2016-10-23 LAB — CBC WITH DIFFERENTIAL/PLATELET
BASOS ABS: 19 {cells}/uL (ref 0–200)
Basophils Relative: 0.4 %
EOS ABS: 58 {cells}/uL (ref 15–500)
EOS PCT: 1.2 %
HCT: 41.6 % (ref 38.5–50.0)
HEMOGLOBIN: 13.9 g/dL (ref 13.2–17.1)
LYMPHS ABS: 1190 {cells}/uL (ref 850–3900)
MCH: 29.6 pg (ref 27.0–33.0)
MCHC: 33.4 g/dL (ref 32.0–36.0)
MCV: 88.7 fL (ref 80.0–100.0)
MONOS PCT: 11.4 %
MPV: 10.3 fL (ref 7.5–12.5)
NEUTROS ABS: 2986 {cells}/uL (ref 1500–7800)
Neutrophils Relative %: 62.2 %
Platelets: 282 10*3/uL (ref 140–400)
RBC: 4.69 10*6/uL (ref 4.20–5.80)
RDW: 13.1 % (ref 11.0–15.0)
Total Lymphocyte: 24.8 %
WBC mixed population: 547 cells/uL (ref 200–950)
WBC: 4.8 10*3/uL (ref 3.8–10.8)

## 2016-10-23 LAB — HEMOGLOBIN A1C
EAG (MMOL/L): 7.3 (calc)
Hgb A1c MFr Bld: 6.2 % of total Hgb — ABNORMAL HIGH (ref <5.7)
MEAN PLASMA GLUCOSE: 131 (calc)

## 2016-10-23 LAB — LIPID PANEL
CHOL/HDL RATIO: 1.9 (calc) (ref <5.0)
CHOLESTEROL: 152 mg/dL (ref <200)
HDL: 79 mg/dL (ref >40)
LDL Cholesterol (Calc): 55 mg/dL (calc)
Non-HDL Cholesterol (Calc): 73 mg/dL (calc) (ref <130)
Triglycerides: 92 mg/dL (ref <150)

## 2016-10-23 LAB — PSA: PSA: 1.5 ng/mL (ref <=4.0)

## 2017-01-19 ENCOUNTER — Other Ambulatory Visit: Payer: Self-pay | Admitting: Family Medicine

## 2017-01-19 DIAGNOSIS — F988 Other specified behavioral and emotional disorders with onset usually occurring in childhood and adolescence: Secondary | ICD-10-CM

## 2017-01-19 NOTE — Telephone Encounter (Signed)
Pt needs refill on generic adderall 10mg    Please call when ready to pick up  Call back is [phone]  Thanks teri

## 2017-01-20 MED ORDER — AMPHETAMINE-DEXTROAMPHETAMINE 10 MG PO TABS: 10.0000 mg | ORAL_TABLET | Freq: Every day | ORAL | 0 refills | Status: DC

## 2017-02-11 ENCOUNTER — Ambulatory Visit: Payer: BC Managed Care – PPO | Admitting: Family Medicine

## 2017-02-26 ENCOUNTER — Telehealth: Payer: Self-pay | Admitting: Family Medicine

## 2017-02-26 DIAGNOSIS — F988 Other specified behavioral and emotional disorders with onset usually occurring in childhood and adolescence: Secondary | ICD-10-CM

## 2017-02-26 MED ORDER — AMPHETAMINE-DEXTROAMPHETAMINE 10 MG PO TABS: 10.0000 mg | ORAL_TABLET | Freq: Every day | ORAL | 0 refills | Status: DC

## 2017-02-26 NOTE — Telephone Encounter (Signed)
Rx for one month supply printed and signed.  Please let patient know to come by and pick it up at his convenience  Bacigalupo, Marzella SchleinAngela M, MD, MPH [facility] 02/26/2017 10:04 AM

## 2017-02-26 NOTE — Telephone Encounter (Signed)
Please review for Dr Gilbert. Thank you-Anastasiya V Hopkins, RMA  

## 2017-02-26 NOTE — Telephone Encounter (Signed)
Pt contacted office for refill request on the following medications:  amphetamine-dextroamphetamine (ADDERALL) 10 MG tablet   Last Rx: 01/20/17  Pt is requesting enough medication to last until his OV on 03/15/17. Pt stated that he is out of the medication and leaving this weekend to go out of town for a week. Pt is requesting another provider approve the medication and he pick it up today if possible since Dr. Sullivan LoneGilbert is out of the office. Please advise. Thanks TNP

## 2017-02-26 NOTE — Telephone Encounter (Signed)
Pt advised-Reef Achterberg V Divon Krabill, RMA  

## 2017-03-15 ENCOUNTER — Encounter: Payer: Self-pay | Admitting: Family Medicine

## 2017-03-15 ENCOUNTER — Ambulatory Visit: Payer: BC Managed Care – PPO | Admitting: Family Medicine

## 2017-03-15 VITALS — BP 128/72 | HR 106 | Temp 97.9°F | Resp 16 | Wt 215.0 lb

## 2017-03-15 DIAGNOSIS — E119 Type 2 diabetes mellitus without complications: Secondary | ICD-10-CM

## 2017-03-15 DIAGNOSIS — I10 Essential (primary) hypertension: Secondary | ICD-10-CM | POA: Diagnosis not present

## 2017-03-15 DIAGNOSIS — F988 Other specified behavioral and emotional disorders with onset usually occurring in childhood and adolescence: Secondary | ICD-10-CM

## 2017-03-15 DIAGNOSIS — E7800 Pure hypercholesterolemia, unspecified: Secondary | ICD-10-CM | POA: Diagnosis not present

## 2017-03-15 DIAGNOSIS — R739 Hyperglycemia, unspecified: Secondary | ICD-10-CM | POA: Diagnosis not present

## 2017-03-15 LAB — POCT GLYCOSYLATED HEMOGLOBIN (HGB A1C): Hemoglobin A1C: 7.1

## 2017-03-15 MED ORDER — FINASTERIDE 5 MG PO TABS: 2.5000 mg | ORAL_TABLET | ORAL | 3 refills | Status: DC

## 2017-03-15 MED ORDER — AMPHETAMINE-DEXTROAMPHETAMINE 10 MG PO TABS: 10.0000 mg | ORAL_TABLET | Freq: Every day | ORAL | 0 refills | Status: DC

## 2017-03-15 MED ORDER — METFORMIN HCL 500 MG PO TABS: 500.0000 mg | ORAL_TABLET | Freq: Every day | ORAL | 3 refills | Status: DC

## 2017-03-15 NOTE — Progress Notes (Signed)
Patient: Antonio GravelDonald D Silvera Male    DOB: 12/31/1955   62 y.o.   MRN: [id number] Visit Date: 03/15/2017  Today's Provider: Megan Mansichard Kieley Akter Jr, MD   Chief Complaint  Patient presents with  . Hyperglycemia  . Hypertension  . ADD   Subjective:    HPI Pt is here for a 4 month follow up. His last A1c was 6.2. He reports that he has been feeling well. He needs refills on his Adderall.  Daughter married last year and is graduating from Speech Therapy grad school this spring and his son is getting married in June.   Hypertension, follow-up:  BP Readings from Last 3 Encounters:  03/15/17 128/72  10/22/16 102/66  07/28/16 112/62    He was last seen for hypertension 4 months ago.  BP at that visit was 128/72. Management since that visit includes none. He reports good compliance with treatment. He is not having side effects.  He is not exercising. He is adherent to low salt diet.   Outside blood pressures are not being checked. Patient denies chest pain, chest pressure/discomfort, claudication, dyspnea, exertional chest pressure/discomfort, fatigue, irregular heart beat, lower extremity edema, near-syncope, orthopnea, palpitations, paroxysmal nocturnal dyspnea, syncope and tachypnea.   Cardiovascular risk factors include dyslipidemia, hypertension, male gender and obesity (BMI >= 30 kg/m2).   Wt Readings from Last 3 Encounters:  03/15/17 215 lb (97.5 kg)  10/22/16 206 lb (93.4 kg)  07/28/16 203 lb (92.1 kg)   ------------------------------------------------------------------------     No Known Allergies   Current Outpatient Medications:  .  amLODipine-benazepril (LOTREL) 5-20 MG capsule, Take 1 capsule by mouth daily., Disp: 30 capsule, Rfl: 12 .  amphetamine-dextroamphetamine (ADDERALL) 10 MG tablet, Take 1 tablet (10 mg total) by mouth daily., Disp: 30 tablet, Rfl: 0 .  amphetamine-dextroamphetamine (ADDERALL) 10 MG tablet, Take 1 tablet (10 mg total) by mouth daily.,  Disp: 30 tablet, Rfl: 0 .  aspirin 81 MG tablet, Take by mouth., Disp: , Rfl:  .  finasteride (PROSCAR) 5 MG tablet, Take 0.5 tablets (2.5 mg total) by mouth every other day., Disp: 8 tablet, Rfl: 12 .  Inulin (METAMUCIL CLEAR & NATURAL) POWD, Take by mouth., Disp: , Rfl:  .  NON FORMULARY, CPAP at night, Disp: , Rfl:  .  Omega-3 Fatty Acids (FISH OIL) 1200 MG CAPS, Take by mouth., Disp: , Rfl:  .  simvastatin (ZOCOR) 20 MG tablet, Take 1 tablet (20 mg total) by mouth at bedtime., Disp: 30 tablet, Rfl: 12 .  allopurinol (ZYLOPRIM) 300 MG tablet, Take 1 tablet (300 mg total) by mouth daily. (Patient not taking: Reported on 03/15/2017), Disp: 30 tablet, Rfl: 12 .  Multiple Vitamins-Minerals (CENTRUM SILVER PO), Take by mouth., Disp: , Rfl:   Review of Systems  Constitutional: Negative.   HENT: Negative.   Eyes: Negative.   Respiratory: Negative.   Cardiovascular: Negative.   Gastrointestinal: Negative.   Endocrine: Negative.   Genitourinary: Negative.   Musculoskeletal: Negative.   Skin: Negative.   Allergic/Immunologic: Negative.   Neurological: Negative.   Hematological: Negative.   Psychiatric/Behavioral: Negative.     Social History   Tobacco Use  . Smoking status: Never Smoker  . Smokeless tobacco: Never Used  Substance Use Topics  . Alcohol use: No   Objective:   BP 128/72 (BP Location: Left Arm, Patient Position: Sitting, Cuff Size: Large)   Pulse (!) 106   Temp 97.9 F (36.6 C) (Oral)   Resp 16  Wt 215 lb (97.5 kg)   BMI 33.67 kg/m  Vitals:   03/15/17 0901  BP: 128/72  Pulse: (!) 106  Resp: 16  Temp: 97.9 F (36.6 C)  TempSrc: Oral  Weight: 215 lb (97.5 kg)     Physical Exam  Constitutional: He is oriented to person, place, and time. He appears well-developed and well-nourished.  HENT:  Head: Normocephalic and atraumatic.  Right Ear: External ear normal.  Left Ear: External ear normal.  Nose: Nose normal.  Eyes: Conjunctivae and EOM are normal.  Pupils are equal, round, and reactive to light. No scleral icterus.  Neck: Normal range of motion. Neck supple. No thyromegaly present.  Cardiovascular: Normal rate, regular rhythm, normal heart sounds and intact distal pulses.  Pulmonary/Chest: Effort normal and breath sounds normal.  Abdominal: Soft.  Musculoskeletal: Normal range of motion.  Neurological: He is alert and oriented to person, place, and time. He has normal reflexes.  Skin: Skin is warm and dry.  Psychiatric: He has a normal mood and affect. His behavior is normal. Judgment and thought content normal.        Assessment & Plan:     1. Essential (primary) hypertension Stable.   2. Hypercholesteremia   3. Elevated blood sugar  - POCT HgB A1C 7.1 today. No diabetic. Start Metformin. Follow up in 4 months.   4. Diabetes mellitus without complication (HCC) Foot exam on next OV.Ne Dx with A1C today. Habits discussed at length.Now diabetic. - metFORMIN (GLUCOPHAGE) 500 MG tablet; Take 1 tablet (500 mg total) by mouth daily with breakfast.  Dispense: 90 tablet; Refill: 3  5. Attention deficit disorder, unspecified hyperactivity presence  - amphetamine-dextroamphetamine (ADDERALL) 10 MG tablet; Take 1 tablet (10 mg total) by mouth daily.  Dispense: 30 tablet; Refill: 0 - amphetamine-dextroamphetamine (ADDERALL) 10 MG tablet; Take 1 tablet (10 mg total) by mouth daily.  Dispense: 30 tablet; Refill: 0      HPI, Exam, and A&P Transcribed under the direction and in the presence of Luisalberto Beegle L. Wendelyn Breslow, MD  Electronically Signed: Silvio Pate, CMA  I have done the exam and reviewed the above chart and it is accurate to the best of my knowledge. Dentist has been used in this note in any air is in the dictation or transcription are unintentional.  [name], MD  [facility]

## 2017-04-28 ENCOUNTER — Other Ambulatory Visit: Payer: Self-pay | Admitting: Family Medicine

## 2017-06-28 ENCOUNTER — Other Ambulatory Visit: Payer: Self-pay | Admitting: Family Medicine

## 2017-06-28 DIAGNOSIS — F988 Other specified behavioral and emotional disorders with onset usually occurring in childhood and adolescence: Secondary | ICD-10-CM

## 2017-06-28 NOTE — Telephone Encounter (Signed)
Please review

## 2017-06-28 NOTE — Telephone Encounter (Signed)
pt needs refill on Adderall 10 mg.  Usually gets it for 3 months at a time  [facility] [address] [employer]

## 2017-06-29 MED ORDER — AMPHETAMINE-DEXTROAMPHETAMINE 10 MG PO TABS: 10.0000 mg | ORAL_TABLET | Freq: Every day | ORAL | 0 refills | Status: DC

## 2017-06-29 NOTE — Telephone Encounter (Signed)
We can do 1 then he needs appt in 1 month for further refills.

## 2017-07-12 ENCOUNTER — Ambulatory Visit: Payer: BC Managed Care – PPO | Admitting: Family Medicine

## 2017-07-12 VITALS — BP 130/78 | HR 100 | Temp 98.0°F | Resp 16 | Wt 207.0 lb

## 2017-07-12 DIAGNOSIS — R739 Hyperglycemia, unspecified: Secondary | ICD-10-CM | POA: Diagnosis not present

## 2017-07-12 DIAGNOSIS — F988 Other specified behavioral and emotional disorders with onset usually occurring in childhood and adolescence: Secondary | ICD-10-CM

## 2017-07-12 DIAGNOSIS — I10 Essential (primary) hypertension: Secondary | ICD-10-CM | POA: Diagnosis not present

## 2017-07-12 DIAGNOSIS — M109 Gout, unspecified: Secondary | ICD-10-CM

## 2017-07-12 DIAGNOSIS — E7800 Pure hypercholesterolemia, unspecified: Secondary | ICD-10-CM | POA: Diagnosis not present

## 2017-07-12 LAB — POCT GLYCOSYLATED HEMOGLOBIN (HGB A1C): HEMOGLOBIN A1C: 6.9 % — AB (ref 4.0–5.6)

## 2017-07-12 MED ORDER — AMPHETAMINE-DEXTROAMPHETAMINE 10 MG PO TABS: 10.0000 mg | ORAL_TABLET | Freq: Every day | ORAL | 0 refills | Status: DC

## 2017-07-12 MED ORDER — ALLOPURINOL 300 MG PO TABS: 300.0000 mg | ORAL_TABLET | Freq: Every day | ORAL | 12 refills | Status: DC

## 2017-07-12 MED ORDER — SIMVASTATIN 20 MG PO TABS: 20.0000 mg | ORAL_TABLET | Freq: Every day | ORAL | 12 refills | Status: DC

## 2017-07-12 MED ORDER — AMLODIPINE BESY-BENAZEPRIL HCL 5-20 MG PO CAPS: 1.0000 | ORAL_CAPSULE | Freq: Every day | ORAL | 12 refills | Status: DC

## 2017-07-12 NOTE — Progress Notes (Signed)
Antonio Whitney  MRN: [id number] DOB: [birth year]  Subjective:  HPI   The patient is a 62 year old male who presents for follow up of his chronic health.  He was last seen on 03/15/17 Hsi son is now married and his daughter graduates soon with Masters in [place]. Diabetes-The patient had A1C on 03/15/17 and it was 7.1.  He was started on Metformin at that time and instructed to return today.  He is not yet checking his glucose at home.  Patient is due for diabetic foot exam.  Hypertension-On his last visit the patient had stable blood pressure. BP Readings from Last 3 Encounters:  07/12/17 130/78  03/15/17 128/72  10/22/16 102/66   ADD-Patient continues with his Adderall  and reports good compliance and tolerance with no adverse effect or complaints.  Patient reports good symptom control.  Patient Active Problem List   Diagnosis Date Noted  . ADD (attention deficit disorder) 09/21/2014  . Elevated blood sugar 09/21/2014  . Gout 09/21/2014  . Hypercholesteremia 09/21/2014  . Adiposity 09/21/2014  . Apnea, sleep 09/21/2014  . Alopecia 06/17/2009  . Colon, diverticulosis 06/17/2009  . Essential (primary) hypertension 06/17/2009  . Avitaminosis D 06/17/2009    No past medical history on file.  Social History   Socioeconomic History  . Marital status: Married    Spouse name: [name]  . Number of children: 2  . Years of education: 57  . Highest education level: Not on file  Occupational History  . Occupation: Art gallery manager now  Social Needs  . Financial resource strain: Not on file  . Food insecurity:    Worry: Not on file    Inability: Not on file  . Transportation needs:    Medical: Not on file    Non-medical: Not on file  Tobacco Use  . Smoking status: Never Smoker  . Smokeless tobacco: Never Used  Substance and Sexual Activity  . Alcohol use: No  . Drug use: No  . Sexual activity: Yes  Lifestyle  . Physical activity:    Days per week: Not on file   Minutes per session: Not on file  . Stress: Not on file  Relationships  . Social connections:    Talks on phone: Not on file    Gets together: Not on file    Attends religious service: Not on file    Active member of club or organization: Not on file    Attends meetings of clubs or organizations: Not on file    Relationship status: Not on file  . Intimate partner violence:    Fear of current or ex partner: Not on file    Emotionally abused: Not on file    Physically abused: Not on file    Forced sexual activity: Not on file  Other Topics Concern  . Not on file  Social History Narrative  . Not on file    Outpatient Encounter Medications as of 07/12/2017  Medication Sig Note  . allopurinol (ZYLOPRIM) 300 MG tablet Take 1 tablet (300 mg total) by mouth daily.   Marland Kitchen amLODipine-benazepril (LOTREL) 5-20 MG capsule Take 1 capsule by mouth daily.   Marland Kitchen amphetamine-dextroamphetamine (ADDERALL) 10 MG tablet Take 1 tablet (10 mg total) by mouth daily.   Marland Kitchen amphetamine-dextroamphetamine (ADDERALL) 10 MG tablet Take 1 tablet (10 mg total) by mouth daily.   Marland Kitchen aspirin 81 MG tablet Take by mouth. 04/14/2016: Not right now  . finasteride (PROSCAR) 5 MG tablet TAKE 1/2 TABLET BY  MOUTH EVERY OTHER DAY   . Inulin (METAMUCIL CLEAR & NATURAL) POWD Take by mouth. 09/21/2014: Received from: [facility]  . metFORMIN (GLUCOPHAGE) 500 MG tablet Take 1 tablet (500 mg total) by mouth daily with breakfast.   . Multiple Vitamins-Minerals (CENTRUM SILVER PO) Take by mouth. 09/21/2014: Received from: [facility]  . NON FORMULARY CPAP at night   . Omega-3 Fatty Acids (FISH OIL) 1200 MG CAPS Take by mouth. 09/21/2014: Received from: [facility]  . simvastatin (ZOCOR) 20 MG tablet Take 1 tablet (20 mg total) by mouth at bedtime.    No facility-administered encounter medications on file as of 07/12/2017.     No Known Allergies  Review of Systems  Constitutional: Negative  for fever and malaise/fatigue.  Eyes: Negative.   Respiratory: Negative for cough, shortness of breath and wheezing.   Cardiovascular: Negative for chest pain, palpitations, orthopnea, claudication, leg swelling and PND.  Gastrointestinal: Negative.   Genitourinary: Negative for frequency.  Skin: Negative.   Neurological: Negative for dizziness and headaches.  Endo/Heme/Allergies: Negative for polydipsia.  Psychiatric/Behavioral: Negative.     Objective:  BP 130/78 (BP Location: Right Arm, Patient Position: Sitting, Cuff Size: Normal)   Pulse 100   Temp 98 F (36.7 C) (Oral)   Resp 16   Wt 207 lb (93.9 kg)   BMI 32.42 kg/m   Physical Exam  Constitutional: He is oriented to person, place, and time and well-developed, well-nourished, and in no distress.  HENT:  Head: Normocephalic and atraumatic.  Eyes: Conjunctivae are normal.  Neck: No thyromegaly present.  Cardiovascular: Normal rate, regular rhythm and normal heart sounds.  Pulmonary/Chest: Effort normal and breath sounds normal.  Abdominal: Soft.  Musculoskeletal: He exhibits no edema.  Neurological: He is alert and oriented to person, place, and time. Gait normal. GCS score is 15.  Monofilament exam normal.  Skin: Skin is warm and dry.  Psychiatric: Mood, memory, affect and judgment normal.    Assessment and Plan :  1. Elevated blood sugar Pt will continue to work on lifestyle. - POCT glycosylated hemoglobin (Hb A1C)--6.9 today  2. Attention deficit disorder, unspecified hyperactivity presence  - amphetamine-dextroamphetamine (ADDERALL) 10 MG tablet; Take 1 tablet (10 mg total) by mouth daily.  Dispense: 30 tablet; Refill: 0 - amphetamine-dextroamphetamine (ADDERALL) 10 MG tablet; Take 1 tablet (10 mg total) by mouth daily.  Dispense: 30 tablet; Refill: 0  3. Essential (primary) hypertension Stop ASA. - amLODipine-benazepril (LOTREL) 5-20 MG capsule; Take 1 capsule by mouth daily.  Dispense: 30 capsule; Refill:  12  4. Hypercholesteremia  - simvastatin (ZOCOR) 20 MG tablet; Take 1 tablet (20 mg total) by mouth at bedtime.  Dispense: 30 tablet; Refill: 12  5. Gout, unspecified cause, unspecified chronicity, unspecified site  - allopurinol (ZYLOPRIM) 300 MG tablet; Take 1 tablet (300 mg total) by mouth daily.  Dispense: 30 tablet; Refill: 12  I have done the exam and reviewed the chart and it is accurate to the best of my knowledge. DentistDragon  technology has been used and  any errors in dictation or transcription are unintentional. Julieanne Mansonichard Sueellen Kayes M.D. [facility]

## 2017-10-25 ENCOUNTER — Other Ambulatory Visit: Payer: Self-pay | Admitting: Family Medicine

## 2017-10-25 DIAGNOSIS — F988 Other specified behavioral and emotional disorders with onset usually occurring in childhood and adolescence: Secondary | ICD-10-CM

## 2017-10-25 MED ORDER — AMPHETAMINE-DEXTROAMPHETAMINE 10 MG PO TABS: 10.0000 mg | ORAL_TABLET | Freq: Every day | ORAL | 0 refills | Status: DC

## 2017-10-25 NOTE — Telephone Encounter (Signed)
Pt needs a refill on Adderall 10 mg  [facility] [address]  Thanks teri

## 2017-11-08 ENCOUNTER — Encounter: Payer: Self-pay | Admitting: Family Medicine

## 2017-11-26 ENCOUNTER — Other Ambulatory Visit: Payer: Self-pay | Admitting: Family Medicine

## 2017-11-26 DIAGNOSIS — F988 Other specified behavioral and emotional disorders with onset usually occurring in childhood and adolescence: Secondary | ICD-10-CM

## 2017-11-26 NOTE — Telephone Encounter (Signed)
Pt needs refill on  Adderall 10 mg   [facility] [address]  CB#   [phone]  Thanks  Barth Kirks

## 2017-11-30 MED ORDER — AMPHETAMINE-DEXTROAMPHETAMINE 10 MG PO TABS: 10.0000 mg | ORAL_TABLET | Freq: Every day | ORAL | 0 refills | Status: DC

## 2017-12-01 ENCOUNTER — Encounter: Payer: Self-pay | Admitting: Family Medicine

## 2017-12-01 ENCOUNTER — Ambulatory Visit (INDEPENDENT_AMBULATORY_CARE_PROVIDER_SITE_OTHER): Payer: BC Managed Care – PPO | Admitting: Family Medicine

## 2017-12-01 ENCOUNTER — Other Ambulatory Visit: Payer: Self-pay | Admitting: Family Medicine

## 2017-12-01 VITALS — BP 130/70 | HR 95 | Temp 97.4°F | Resp 16 | Wt 209.4 lb

## 2017-12-01 DIAGNOSIS — Z23 Encounter for immunization: Secondary | ICD-10-CM

## 2017-12-01 DIAGNOSIS — Z0000 Encounter for general adult medical examination without abnormal findings: Secondary | ICD-10-CM

## 2017-12-01 DIAGNOSIS — E7800 Pure hypercholesterolemia, unspecified: Secondary | ICD-10-CM | POA: Diagnosis not present

## 2017-12-01 DIAGNOSIS — Z125 Encounter for screening for malignant neoplasm of prostate: Secondary | ICD-10-CM

## 2017-12-01 DIAGNOSIS — E119 Type 2 diabetes mellitus without complications: Secondary | ICD-10-CM

## 2017-12-01 DIAGNOSIS — I10 Essential (primary) hypertension: Secondary | ICD-10-CM

## 2017-12-01 DIAGNOSIS — E559 Vitamin D deficiency, unspecified: Secondary | ICD-10-CM

## 2017-12-01 LAB — POCT URINALYSIS DIPSTICK
BILIRUBIN UA: NEGATIVE
Glucose, UA: NEGATIVE
Ketones, UA: NEGATIVE
LEUKOCYTES UA: NEGATIVE
NITRITE UA: NEGATIVE
PH UA: 6.5 (ref 5.0–8.0)
Protein, UA: NEGATIVE
RBC UA: NEGATIVE
Spec Grav, UA: 1.015 (ref 1.010–1.025)
UROBILINOGEN UA: 0.2 U/dL

## 2017-12-01 NOTE — Progress Notes (Signed)
Patient: Antonio Whitney, Male    DOB: 06/04/1955, 62 y.o.   MRN: [id number] Visit Date: 12/01/2017  Today's Provider: [name], MD   Chief Complaint  Patient presents with  . Annual Exam   Subjective:    Annual physical exam Antonio Whitney is a 62 y.o. male who presents today for health maintenance and complete physical. He feels well. He reports exercising 5x a week between weight lifting, cardio and walking. He reports he is sleeping well. He is doing well with ork,marriage is good and both children are married and well.   Last reported  Tdap- 07/01/11 Pneumo 23- 10/22/16 Zoster- 10/16/15 Colonoscopy-03/02/16 Eye exam -04/24/16 -----------------------------------------------------------------   Review of Systems  Constitutional: Negative.   HENT: Negative.   Eyes: Negative.   Respiratory: Positive for apnea.   Cardiovascular: Negative.   Gastrointestinal: Negative.   Endocrine: Negative.   Genitourinary: Negative.   Musculoskeletal: Negative.   Allergic/Immunologic: Negative.   Neurological: Negative.   Hematological: Negative.   Psychiatric/Behavioral: Negative.     Social History He  reports that he has never smoked. He has never used smokeless tobacco. He reports that he does not drink alcohol or use drugs. Social History   Socioeconomic History  . Marital status: Married    Spouse name: [name]  . Number of children: 2  . Years of education: 76  . Highest education level: Not on file  Occupational History  . Occupation: Art gallery manager now  Social Needs  . Financial resource strain: Not on file  . Food insecurity:    Worry: Not on file    Inability: Not on file  . Transportation needs:    Medical: Not on file    Non-medical: Not on file  Tobacco Use  . Smoking status: Never Smoker  . Smokeless tobacco: Never Used  Substance and Sexual Activity  . Alcohol use: No  . Drug use: No  . Sexual activity: Yes  Lifestyle  .  Physical activity:    Days per week: Not on file    Minutes per session: Not on file  . Stress: Not on file  Relationships  . Social connections:    Talks on phone: Not on file    Gets together: Not on file    Attends religious service: Not on file    Active member of club or organization: Not on file    Attends meetings of clubs or organizations: Not on file    Relationship status: Not on file  Other Topics Concern  . Not on file  Social History Narrative  . Not on file    Patient Active Problem List   Diagnosis Date Noted  . ADD (attention deficit disorder) 09/21/2014  . Elevated blood sugar 09/21/2014  . Gout 09/21/2014  . Hypercholesteremia 09/21/2014  . Adiposity 09/21/2014  . Apnea, sleep 09/21/2014  . Alopecia 06/17/2009  . Colon, diverticulosis 06/17/2009  . Essential (primary) hypertension 06/17/2009  . Avitaminosis D 06/17/2009    Past Surgical History:  Procedure Laterality Date  . APPENDECTOMY    . HERNIA REPAIR     inguinal left-1980    Family History  Family Status  Relation Name Status  . Mother  Deceased at age 40  . Father  Deceased at age 5       laryngeal cancer  . Brother  Alive   His family history includes Breast cancer in his mother; Lung cancer in his father.  No Known Allergies  Previous Medications   ALLOPURINOL (ZYLOPRIM) 300 MG TABLET    Take 1 tablet (300 mg total) by mouth daily.   AMLODIPINE-BENAZEPRIL (LOTREL) 5-20 MG CAPSULE    Take 1 capsule by mouth daily.   AMPHETAMINE-DEXTROAMPHETAMINE (ADDERALL) 10 MG TABLET    Take 1 tablet (10 mg total) by mouth daily.   AMPHETAMINE-DEXTROAMPHETAMINE (ADDERALL) 10 MG TABLET    Take 1 tablet (10 mg total) by mouth daily with breakfast.   AMPHETAMINE-DEXTROAMPHETAMINE (ADDERALL) 10 MG TABLET    Take 1 tablet (10 mg total) by mouth daily.   FINASTERIDE (PROSCAR) 5 MG TABLET    TAKE 1/2 TABLET BY MOUTH EVERY OTHER DAY   INULIN (METAMUCIL CLEAR & NATURAL) POWD    Take by mouth.    METFORMIN (GLUCOPHAGE) 500 MG TABLET    Take 1 tablet (500 mg total) by mouth daily with breakfast.   MULTIPLE VITAMINS-MINERALS (CENTRUM SILVER 50+MEN PO)    daily.   MULTIPLE VITAMINS-MINERALS (CENTRUM SILVER PO)    Take by mouth.   NON FORMULARY    CPAP at night   OMEGA-3 FATTY ACIDS (FISH OIL) 1200 MG CAPS    Take by mouth.   SIMVASTATIN (ZOCOR) 20 MG TABLET    Take 1 tablet (20 mg total) by mouth at bedtime.    Patient Care Team: Maple Hudson., MD as PCP - General (Family Medicine)      Objective:   Vitals: BP 130/70   Pulse 95   Temp (!) 97.4 F (36.3 C) (Oral)   Resp 16   Wt 209 lb 6.4 oz (95 kg)   SpO2 97%   BMI 32.80 kg/m    Physical Exam  Constitutional: He is oriented to person, place, and time. He appears well-developed and well-nourished.  HENT:  Head: Normocephalic and atraumatic.  Right Ear: External ear normal.  Left Ear: External ear normal.  Nose: Nose normal.  Mouth/Throat: Oropharynx is clear and moist.  Eyes: Pupils are equal, round, and reactive to light. Conjunctivae are normal. No scleral icterus.  Neck: No thyromegaly present.  Cardiovascular: Normal rate, regular rhythm, normal heart sounds and intact distal pulses.  Pulmonary/Chest: Effort normal and breath sounds normal.  Genitourinary: Penis normal.  Musculoskeletal: He exhibits no edema.  Lymphadenopathy:    He has no cervical adenopathy.  Neurological: He is alert and oriented to person, place, and time.  Skin: Skin is warm and dry.  Psychiatric: He has a normal mood and affect. His behavior is normal. Judgment and thought content normal.     Depression Screen PHQ 2/9 Scores 12/01/2017 10/22/2016 10/16/2015 10/11/2014  PHQ - 2 Score 0 0 0 0  PHQ- 9 Score 0 1 - -    Functional Status Survey: Is the patient deaf or have difficulty hearing?: No Does the patient have difficulty seeing, even when wearing glasses/contacts?: No Does the patient have difficulty concentrating,  remembering, or making decisions?: No Does the patient have difficulty walking or climbing stairs?: No Does the patient have difficulty dressing or bathing?: No Does the patient have difficulty doing errands alone such as visiting a doctor's office or shopping?: No    Assessment & Plan:     Routine Health Maintenance and Physical Exam  Exercise Activities and Dietary recommendations Goals   None     Immunization History  Administered Date(s) Administered  . Influenza,inj,Quad PF,6+ Mos 10/11/2014, 12/14/2015, 10/22/2016  . Pneumococcal Polysaccharide-23 10/22/2016  . Tdap 07/01/2011  . Zoster 10/16/2015  Health Maintenance  Topic Date Due  . HIV Screening  08/04/1970  . FOOT EXAM  04/14/2017  . OPHTHALMOLOGY EXAM  04/24/2017  . INFLUENZA VACCINE  09/09/2017  . HEMOGLOBIN A1C  01/11/2018  . COLONOSCOPY  03/02/2021  . TETANUS/TDAP  06/30/2021  . PNEUMOCOCCAL POLYSACCHARIDE VACCINE AGE 59-64 HIGH RISK  Completed  . Hepatitis C Screening  Completed     Discussed health benefits of physical activity, and encouraged him to engage in regular exercise appropriate for his age and condition.  ADD Adderall refilled.RTC 4 months.   -------------------------------------------------------------------- I have done the exam and reviewed the chart and it is accurate to the best of my knowledge. Dentist has been used and  any errors in dictation or transcription are unintentional. Julieanne Manson M.D. [facility]

## 2017-12-02 LAB — CBC WITH DIFFERENTIAL/PLATELET
Basophils Absolute: 0 10*3/uL (ref 0.0–0.2)
Basos: 0 %
EOS (ABSOLUTE): 0.1 10*3/uL (ref 0.0–0.4)
EOS: 1 %
HEMATOCRIT: 41.1 % (ref 37.5–51.0)
Hemoglobin: 13.7 g/dL (ref 13.0–17.7)
Immature Grans (Abs): 0 10*3/uL (ref 0.0–0.1)
Immature Granulocytes: 0 %
LYMPHS ABS: 1.3 10*3/uL (ref 0.7–3.1)
Lymphs: 20 %
MCH: 29.9 pg (ref 26.6–33.0)
MCHC: 33.3 g/dL (ref 31.5–35.7)
MCV: 90 fL (ref 79–97)
MONOS ABS: 0.6 10*3/uL (ref 0.1–0.9)
Monocytes: 8 %
NEUTROS ABS: 4.8 10*3/uL (ref 1.4–7.0)
Neutrophils: 71 %
Platelets: 310 10*3/uL (ref 150–450)
RBC: 4.58 x10E6/uL (ref 4.14–5.80)
RDW: 12.6 % (ref 12.3–15.4)
WBC: 6.8 10*3/uL (ref 3.4–10.8)

## 2017-12-02 LAB — COMPREHENSIVE METABOLIC PANEL
ALT: 36 IU/L (ref 0–44)
AST: 22 IU/L (ref 0–40)
Albumin/Globulin Ratio: 1.7 (ref 1.2–2.2)
Albumin: 4.5 g/dL (ref 3.6–4.8)
Alkaline Phosphatase: 71 IU/L (ref 39–117)
BILIRUBIN TOTAL: 0.4 mg/dL (ref 0.0–1.2)
BUN/Creatinine Ratio: 15 (ref 10–24)
BUN: 17 mg/dL (ref 8–27)
CO2: 22 mmol/L (ref 20–29)
Calcium: 9.7 mg/dL (ref 8.6–10.2)
Chloride: 101 mmol/L (ref 96–106)
Creatinine, Ser: 1.1 mg/dL (ref 0.76–1.27)
GFR calc Af Amer: 83 mL/min/{1.73_m2} (ref >59)
GFR calc non Af Amer: 72 mL/min/{1.73_m2} (ref >59)
GLOBULIN, TOTAL: 2.6 g/dL (ref 1.5–4.5)
Glucose: 127 mg/dL — ABNORMAL HIGH (ref 65–99)
POTASSIUM: 4 mmol/L (ref 3.5–5.2)
SODIUM: 138 mmol/L (ref 134–144)
Total Protein: 7.1 g/dL (ref 6.0–8.5)

## 2017-12-02 LAB — HEMOGLOBIN A1C
ESTIMATED AVERAGE GLUCOSE: 137 mg/dL
Hgb A1c MFr Bld: 6.4 % — ABNORMAL HIGH (ref 4.8–5.6)

## 2017-12-02 LAB — LIPID PANEL
Chol/HDL Ratio: 2.2 ratio (ref 0.0–5.0)
Cholesterol, Total: 171 mg/dL (ref 100–199)
HDL: 78 mg/dL (ref >39)
LDL Calculated: 73 mg/dL (ref 0–99)
TRIGLYCERIDES: 101 mg/dL (ref 0–149)
VLDL Cholesterol Cal: 20 mg/dL (ref 5–40)

## 2017-12-02 LAB — VITAMIN D 25 HYDROXY (VIT D DEFICIENCY, FRACTURES): Vit D, 25-Hydroxy: 40.1 ng/mL (ref 30.0–100.0)

## 2017-12-02 LAB — TSH: TSH: 1.27 u[IU]/mL (ref 0.450–4.500)

## 2017-12-02 LAB — PSA: PROSTATE SPECIFIC AG, SERUM: 2.1 ng/mL (ref 0.0–4.0)

## 2018-02-26 ENCOUNTER — Other Ambulatory Visit: Payer: Self-pay | Admitting: Family Medicine

## 2018-02-26 DIAGNOSIS — I10 Essential (primary) hypertension: Secondary | ICD-10-CM

## 2018-02-26 DIAGNOSIS — M109 Gout, unspecified: Secondary | ICD-10-CM

## 2018-02-28 ENCOUNTER — Other Ambulatory Visit: Payer: Self-pay | Admitting: Family Medicine

## 2018-02-28 DIAGNOSIS — F988 Other specified behavioral and emotional disorders with onset usually occurring in childhood and adolescence: Secondary | ICD-10-CM

## 2018-02-28 NOTE — Telephone Encounter (Signed)
Needing 2 refills on: amphetamine-dextroamphetamine (ADDERALL) 10 MG tablet 1 for 30 day today And 2nd for before his visit in Feb.  Please fill at:   [id number] - [place], [place] - [address] AT [facility] [address] [phone] (Phone) [phone] (Fax)   Thanks, [facility]

## 2018-03-02 MED ORDER — AMPHETAMINE-DEXTROAMPHETAMINE 10 MG PO TABS: 10.0000 mg | ORAL_TABLET | Freq: Every day | ORAL | 0 refills | Status: DC

## 2018-03-12 ENCOUNTER — Other Ambulatory Visit: Payer: Self-pay | Admitting: Family Medicine

## 2018-03-12 DIAGNOSIS — E119 Type 2 diabetes mellitus without complications: Secondary | ICD-10-CM

## 2018-03-30 ENCOUNTER — Other Ambulatory Visit: Payer: Self-pay | Admitting: Family Medicine

## 2018-03-30 MED ORDER — AMPHETAMINE-DEXTROAMPHETAMINE 10 MG PO TABS: 10.0000 mg | ORAL_TABLET | Freq: Every day | ORAL | 0 refills | Status: DC

## 2018-03-30 NOTE — Telephone Encounter (Signed)
Please review. Thanks!  

## 2018-03-30 NOTE — Telephone Encounter (Signed)
amphetamine-dextroamphetamine (ADDERALL) 10 MG tablet Pt needing the refill today if possible.  Please fill at:  [id number] - [place], [place] - [address] AT [facility] [address] [phone] (Phone) [phone] (Fax)   Thanks, [facility]

## 2018-04-04 ENCOUNTER — Ambulatory Visit: Payer: Self-pay | Admitting: Family Medicine

## 2018-04-07 ENCOUNTER — Other Ambulatory Visit: Payer: Self-pay

## 2018-04-07 ENCOUNTER — Encounter: Payer: Self-pay | Admitting: Family Medicine

## 2018-04-07 ENCOUNTER — Ambulatory Visit: Payer: BC Managed Care – PPO | Admitting: Family Medicine

## 2018-04-07 VITALS — BP 143/78 | HR 83 | Ht 67.0 in | Wt 207.8 lb

## 2018-04-07 DIAGNOSIS — I10 Essential (primary) hypertension: Secondary | ICD-10-CM

## 2018-04-07 DIAGNOSIS — F988 Other specified behavioral and emotional disorders with onset usually occurring in childhood and adolescence: Secondary | ICD-10-CM | POA: Diagnosis not present

## 2018-04-07 DIAGNOSIS — E119 Type 2 diabetes mellitus without complications: Secondary | ICD-10-CM | POA: Diagnosis not present

## 2018-04-07 DIAGNOSIS — E7800 Pure hypercholesterolemia, unspecified: Secondary | ICD-10-CM

## 2018-04-07 MED ORDER — SIMVASTATIN 20 MG PO TABS: 20.0000 mg | ORAL_TABLET | Freq: Every day | ORAL | 12 refills | Status: DC

## 2018-04-07 MED ORDER — AMPHETAMINE-DEXTROAMPHETAMINE 10 MG PO TABS: 10.0000 mg | ORAL_TABLET | Freq: Every day | ORAL | 0 refills | Status: DC

## 2018-04-07 MED ORDER — FINASTERIDE 5 MG PO TABS: 2.5000 mg | ORAL_TABLET | ORAL | 11 refills | Status: DC

## 2018-04-07 NOTE — Progress Notes (Signed)
Patient: Antonio Whitney Male    DOB: 08-Jun-1955   63 y.o.   MRN: [id number] Visit Date: 04/07/2018  Today's Provider: [name], MD   Chief Complaint  Patient presents with  . Diabetes    4 month fup  . ADHD    4 month fup   Subjective:     HPI   Diabetes Mellitus Type II, Follow-up and ADD:   Lab Results  Component Value Date   HGBA1C 6.4 (H) 12/01/2017   HGBA1C 6.9 (A) 07/12/2017   HGBA1C 7.1 03/15/2017    Last seen for diabetes 4 months ago.  Management since then includes Metformin.Marland Kitchen He reports good compliance with treatment. He is not having side effects.  Current symptoms include none and have been stable. Home blood sugar records: fasting range: 130  Episodes of hypoglycemia? no   Current Insulin Regimen:  Most Recent Eye Exam:  Weight trend: stable Prior visit with dietician: Yes  Current exercise: walking Current diet habits: well balanced   Pertinent Labs:    Component Value Date/Time   CHOL 171 12/01/2017 1031   TRIG 101 12/01/2017 1031   HDL 78 12/01/2017 1031   LDLCALC 73 12/01/2017 1031   LDLCALC 55 10/22/2016 1029   CREATININE 1.10 12/01/2017 1031   CREATININE 1.08 10/22/2016 1029    Wt Readings from Last 3 Encounters:  04/07/18 207 lb 12.8 oz (94.3 kg)  12/01/17 209 lb 6.4 oz (95 kg)  07/12/17 207 lb (93.9 kg)   Patient is physically active.  Work is very stressful and he is working a lot of jobs now. ------------------------------------------------------------------------   No Known Allergies   Current Outpatient Medications:  .  allopurinol (ZYLOPRIM) 300 MG tablet, TAKE 1 TABLET(300 MG) BY MOUTH DAILY, Disp: 30 tablet, Rfl: 12 .  amLODipine-benazepril (LOTREL) 5-20 MG capsule, TAKE 1 CAPSULE BY MOUTH DAILY, Disp: 30 capsule, Rfl: 12 .  amphetamine-dextroamphetamine (ADDERALL) 10 MG tablet, Take 1 tablet (10 mg total) by mouth daily., Disp: 30 tablet, Rfl: 0 .  amphetamine-dextroamphetamine (ADDERALL) 10 MG  tablet, Take 1 tablet (10 mg total) by mouth daily., Disp: 30 tablet, Rfl: 0 .  amphetamine-dextroamphetamine (ADDERALL) 10 MG tablet, Take 1 tablet (10 mg total) by mouth daily with breakfast., Disp: 30 tablet, Rfl: 0 .  finasteride (PROSCAR) 5 MG tablet, TAKE 1/2 TABLET BY MOUTH EVERY OTHER DAY, Disp: 30 tablet, Rfl: 11 .  Inulin (METAMUCIL CLEAR & NATURAL) POWD, Take by mouth., Disp: , Rfl:  .  metFORMIN (GLUCOPHAGE) 500 MG tablet, TAKE 1 TABLET(500 MG) BY MOUTH DAILY WITH BREAKFAST, Disp: 90 tablet, Rfl: 3 .  Multiple Vitamins-Minerals (CENTRUM SILVER 50+MEN PO), daily., Disp: , Rfl:  .  Multiple Vitamins-Minerals (CENTRUM SILVER PO), Take by mouth., Disp: , Rfl:  .  NON FORMULARY, CPAP at night, Disp: , Rfl:  .  Omega-3 Fatty Acids (FISH OIL) 1200 MG CAPS, Take by mouth., Disp: , Rfl:  .  simvastatin (ZOCOR) 20 MG tablet, Take 1 tablet (20 mg total) by mouth at bedtime., Disp: 30 tablet, Rfl: 12  Review of Systems  Constitutional: Negative.   HENT: Negative.   Eyes: Negative.   Respiratory: Negative.   Cardiovascular: Negative.   Gastrointestinal: Negative.   Endocrine: Negative.   Genitourinary: Negative.   Musculoskeletal: Negative.   Skin: Negative.   Allergic/Immunologic: Negative.   Neurological: Negative.   Hematological: Negative.   Psychiatric/Behavioral: Negative.     Social History   Tobacco Use  .  Smoking status: Never Smoker  . Smokeless tobacco: Never Used  Substance Use Topics  . Alcohol use: No      Objective:   BP (!) 143/78 (BP Location: Right Arm, Patient Position: Sitting, Cuff Size: Normal)   Pulse 83   Ht 5\' 7"  (1.702 m)   Wt 207 lb 12.8 oz (94.3 kg)   SpO2 98%   BMI 32.55 kg/m  Vitals:   04/07/18 1619  BP: (!) 143/78  Pulse: 83  SpO2: 98%  Weight: 207 lb 12.8 oz (94.3 kg)  Height: 5\' 7"  (1.702 m)     Physical Exam Constitutional:      Appearance: He is well-developed.  HENT:     Head: Normocephalic and atraumatic.     Right Ear:  External ear normal.     Left Ear: External ear normal.     Nose: Nose normal.  Eyes:     General: No scleral icterus.    Conjunctiva/sclera: Conjunctivae normal.     Pupils: Pupils are equal, round, and reactive to light.  Neck:     Thyroid: No thyromegaly.  Cardiovascular:     Rate and Rhythm: Normal rate and regular rhythm.     Heart sounds: Normal heart sounds.  Pulmonary:     Effort: Pulmonary effort is normal.     Breath sounds: Normal breath sounds.  Abdominal:     Palpations: Abdomen is soft.  Lymphadenopathy:     Cervical: No cervical adenopathy.  Skin:    General: Skin is warm and dry.  Neurological:     Mental Status: He is alert and oriented to person, place, and time.  Psychiatric:        Behavior: Behavior normal.        Thought Content: Thought content normal.        Judgment: Judgment normal.         Assessment & Plan    1. Diabetes mellitus without complication (HCC) Lifestyle stressed and patient is in agreement. - POCT glycosylated hemoglobin (Hb A1C)--7.2 today  2. Hypercholesteremia  - simvastatin (ZOCOR) 20 MG tablet; Take 1 tablet (20 mg total) by mouth at bedtime.  Dispense: 30 tablet; Refill: 12  3. Attention deficit disorder, unspecified hyperactivity presence Refill x3.  Return to clinic 4 months. - amphetamine-dextroamphetamine (ADDERALL) 10 MG tablet; Take 1 tablet (10 mg total) by mouth daily.  Dispense: 30 tablet; Refill: 0 - amphetamine-dextroamphetamine (ADDERALL) 10 MG tablet; Take 1 tablet (10 mg total) by mouth daily.  Dispense: 30 tablet; Refill: 0  4. Essential (primary) hypertension Fair control.  Patient to check home blood pressures.   I have done the exam and reviewed the above chart and it is accurate to the best of my knowledge. Dentist has been used in this note in any air is in the dictation or transcription are unintentional.  [name], MD  [facility]

## 2018-04-07 NOTE — Patient Instructions (Addendum)
Blood pressure cuff [place].

## 2018-04-20 LAB — POCT GLYCOSYLATED HEMOGLOBIN (HGB A1C): Hemoglobin A1C: 7.2 % — AB (ref 4.0–5.6)

## 2018-04-28 ENCOUNTER — Telehealth: Payer: Self-pay | Admitting: Family Medicine

## 2018-04-28 NOTE — Telephone Encounter (Signed)
Pt called saying the pharmacy said they need a PA on Adderall 10 mg.  The pharmacy said said they faxed a request to [place] this past weekend  This is the number to call about the [phone]    Pt's CB#  [phone]  Thanks teri

## 2018-05-02 NOTE — Telephone Encounter (Signed)
Have not seen PA request. Will attempt to start PA.

## 2018-05-02 NOTE — Telephone Encounter (Signed)
Pt called back regarding his Adderall prescription  Antonio Whitney

## 2018-05-04 NOTE — Telephone Encounter (Signed)
PA for Adderall was approved.

## 2018-07-28 ENCOUNTER — Other Ambulatory Visit: Payer: Self-pay | Admitting: Family Medicine

## 2018-07-28 DIAGNOSIS — F988 Other specified behavioral and emotional disorders with onset usually occurring in childhood and adolescence: Secondary | ICD-10-CM

## 2018-07-28 MED ORDER — AMPHETAMINE-DEXTROAMPHETAMINE 10 MG PO TABS: 10.0000 mg | ORAL_TABLET | Freq: Every day | ORAL | 0 refills | Status: DC

## 2018-07-28 NOTE — Telephone Encounter (Signed)
Please review. Thanks!  

## 2018-07-28 NOTE — Telephone Encounter (Signed)
Pt needs refill   Adderall 10 mg  [facility] [address]  Thanks teri

## 2018-08-09 ENCOUNTER — Other Ambulatory Visit: Payer: Self-pay

## 2018-08-09 ENCOUNTER — Ambulatory Visit: Payer: BC Managed Care – PPO | Admitting: Family Medicine

## 2018-08-09 ENCOUNTER — Encounter: Payer: Self-pay | Admitting: Family Medicine

## 2018-08-09 VITALS — BP 127/76 | HR 93 | Temp 98.2°F | Resp 16 | Wt 206.0 lb

## 2018-08-09 DIAGNOSIS — M109 Gout, unspecified: Secondary | ICD-10-CM

## 2018-08-09 DIAGNOSIS — E119 Type 2 diabetes mellitus without complications: Secondary | ICD-10-CM | POA: Diagnosis not present

## 2018-08-09 DIAGNOSIS — F988 Other specified behavioral and emotional disorders with onset usually occurring in childhood and adolescence: Secondary | ICD-10-CM

## 2018-08-09 DIAGNOSIS — I10 Essential (primary) hypertension: Secondary | ICD-10-CM | POA: Diagnosis not present

## 2018-08-09 DIAGNOSIS — E7800 Pure hypercholesterolemia, unspecified: Secondary | ICD-10-CM

## 2018-08-09 LAB — POCT GLYCOSYLATED HEMOGLOBIN (HGB A1C): Hemoglobin A1C: 7.1 % — AB (ref 4.0–5.6)

## 2018-08-09 MED ORDER — AMPHETAMINE-DEXTROAMPHETAMINE 10 MG PO TABS: 10.0000 mg | ORAL_TABLET | Freq: Every day | ORAL | 0 refills | Status: DC

## 2018-08-09 MED ORDER — AMLODIPINE BESY-BENAZEPRIL HCL 5-20 MG PO CAPS: 1.0000 | ORAL_CAPSULE | Freq: Every day | ORAL | 3 refills | Status: DC

## 2018-08-09 MED ORDER — ALLOPURINOL 300 MG PO TABS: ORAL_TABLET | ORAL | 12 refills | Status: DC

## 2018-08-09 NOTE — Progress Notes (Signed)
Patient: Antonio Whitney Male    DOB: 1955/04/27   63 y.o.   MRN: [id number] Visit Date: 08/09/2018  Today's Provider: Wilhemena Durie, MD   Chief Complaint  Patient presents with  . Diabetes  . Hypertension  . Hyperlipidemia   Subjective:     HPI  Pt feels well Exercising daily. Considering trying to come off of Adderall.  Diabetes Mellitus Type II, Follow-up:   Lab Results  Component Value Date   HGBA1C 7.2 (A) 04/20/2018   HGBA1C 6.4 (H) 12/01/2017   HGBA1C 6.9 (A) 07/12/2017   Last seen for diabetes 4 months ago.  Management since then includes none. He reports excellent compliance with treatment. He is not having side effects.  Current symptoms include none and have been unchanged. Home blood sugar records: not being checked regularly  Episodes of hypoglycemia? no   Current Insulin Regimen: N/A Most Recent Eye Exam: 07/2018 patient reports Weight trend: decreasing steadily Prior visit with dietician: no Current diet: in general, a "healthy" diet   Current exercise: bicycling and walking  ------------------------------------------------------------------------   Hypertension, follow-up:  BP Readings from Last 3 Encounters:  08/09/18 127/76  04/07/18 (!) 143/78  12/01/17 130/70    He was last seen for hypertension 4 months ago.  BP at that visit was 143/78. Management since that visit includes none.He reports excellent compliance with treatment. He is not having side effects.  He is exercising. He is adherent to low salt diet.   Outside blood pressures are not being checked regularly . He is experiencing none.  Patient denies chest pain, chest pressure/discomfort, claudication, dyspnea, exertional chest pressure/discomfort, fatigue, irregular heart beat, lower extremity edema, near-syncope, orthopnea, palpitations, paroxysmal nocturnal dyspnea, syncope and tachypnea.   Cardiovascular risk factors include advanced age (older than 75 for men,  23 for women), diabetes mellitus, hypertension and male gender.  Use of agents associated with hypertension: none.   ------------------------------------------------------------------------    Lipid/Cholesterol, Follow-up:   Last seen for this 4 months ago.  Management since that visit includes none.  Last Lipid Panel:    Component Value Date/Time   CHOL 171 12/01/2017 1031   TRIG 101 12/01/2017 1031   HDL 78 12/01/2017 1031   CHOLHDL 2.2 12/01/2017 1031   CHOLHDL 1.9 10/22/2016 1029   LDLCALC 73 12/01/2017 1031   LDLCALC 55 10/22/2016 1029    He reports excellent compliance with treatment. He is not having side effects.   Wt Readings from Last 3 Encounters:  08/09/18 206 lb (93.4 kg)  04/07/18 207 lb 12.8 oz (94.3 kg)  12/01/17 209 lb 6.4 oz (95 kg)    ------------------------------------------------------------------------  No Known Allergies   Current Outpatient Medications:  .  allopurinol (ZYLOPRIM) 300 MG tablet, TAKE 1 TABLET(300 MG) BY MOUTH DAILY, Disp: 30 tablet, Rfl: 12 .  amLODipine-benazepril (LOTREL) 5-20 MG capsule, TAKE 1 CAPSULE BY MOUTH DAILY, Disp: 30 capsule, Rfl: 12 .  amphetamine-dextroamphetamine (ADDERALL) 10 MG tablet, Take 1 tablet (10 mg total) by mouth daily., Disp: 30 tablet, Rfl: 0 .  amphetamine-dextroamphetamine (ADDERALL) 10 MG tablet, Take 1 tablet (10 mg total) by mouth daily with breakfast., Disp: 30 tablet, Rfl: 0 .  amphetamine-dextroamphetamine (ADDERALL) 10 MG tablet, Take 1 tablet (10 mg total) by mouth daily., Disp: 30 tablet, Rfl: 0 .  finasteride (PROSCAR) 5 MG tablet, Take 0.5 tablets (2.5 mg total) by mouth every other day., Disp: 30 tablet, Rfl: 11 .  Inulin (METAMUCIL CLEAR & NATURAL) POWD,  Take by mouth., Disp: , Rfl:  .  metFORMIN (GLUCOPHAGE) 500 MG tablet, TAKE 1 TABLET(500 MG) BY MOUTH DAILY WITH BREAKFAST, Disp: 90 tablet, Rfl: 3 .  Multiple Vitamins-Minerals (CENTRUM SILVER 50+MEN PO), daily., Disp: , Rfl:  .   Multiple Vitamins-Minerals (CENTRUM SILVER PO), Take by mouth., Disp: , Rfl:  .  NON FORMULARY, CPAP at night, Disp: , Rfl:  .  Omega-3 Fatty Acids (FISH OIL) 1200 MG CAPS, Take by mouth., Disp: , Rfl:  .  simvastatin (ZOCOR) 20 MG tablet, Take 1 tablet (20 mg total) by mouth at bedtime., Disp: 30 tablet, Rfl: 12  Review of Systems  Constitutional: Negative.   HENT: Negative.   Respiratory: Negative.   Cardiovascular: Negative.   Gastrointestinal: Negative.   Endocrine: Negative.   Musculoskeletal: Negative.   Allergic/Immunologic: Negative.   Neurological: Negative.   Hematological: Negative.   Psychiatric/Behavioral: Negative.     Social History   Tobacco Use  . Smoking status: Never Smoker  . Smokeless tobacco: Never Used  Substance Use Topics  . Alcohol use: No      Objective:   BP 127/76   Pulse 93   Temp 98.2 F (36.8 C) (Oral)   Resp 16   Wt 206 lb (93.4 kg)   BMI 32.26 kg/m  Vitals:   08/09/18 0818  BP: 127/76  Pulse: 93  Resp: 16  Temp: 98.2 F (36.8 C)  TempSrc: Oral  Weight: 206 lb (93.4 kg)     Physical Exam Vitals signs reviewed.  Constitutional:      Appearance: He is well-developed.  HENT:     Head: Normocephalic and atraumatic.     Right Ear: External ear normal.     Left Ear: External ear normal.     Nose: Nose normal.  Eyes:     General: No scleral icterus.    Conjunctiva/sclera: Conjunctivae normal.     Pupils: Pupils are equal, round, and reactive to light.  Neck:     Thyroid: No thyromegaly.  Cardiovascular:     Rate and Rhythm: Normal rate and regular rhythm.     Heart sounds: Normal heart sounds.  Pulmonary:     Effort: Pulmonary effort is normal.     Breath sounds: Normal breath sounds.  Abdominal:     Palpations: Abdomen is soft.  Lymphadenopathy:     Cervical: No cervical adenopathy.  Skin:    General: Skin is warm and dry.  Neurological:     Mental Status: He is alert and oriented to person, place, and time.   Psychiatric:        Behavior: Behavior normal.        Thought Content: Thought content normal.        Judgment: Judgment normal.      No results found for any visits on 08/09/18.     Assessment & Plan    1. Diabetes mellitus without complication (HCC)  Good control.RTC 4 months - POCT glycosylated hemoglobin (Hb A1C)--7.1   2. Gout, unspecified cause, unspecified chronicity, unspecified site  - allopurinol (ZYLOPRIM) 300 MG tablet; TAKE 1 TABLET(300 MG) BY MOUTH DAILY  Dispense: 30 tablet; Refill: 12  3. Essential (primary) hypertension Controlled on Lotrel.  - amLODipine-benazepril (LOTREL) 5-20 MG capsule; Take 1 capsule by mouth daily.  Dispense: 90 capsule; Refill: 3  4. Attention deficit disorder, unspecified hyperactivity presence Try to cut back on regular use. - amphetamine-dextroamphetamine (ADDERALL) 10 MG tablet; Take 1 tablet (10 mg total) by mouth daily.  Dispense: 30 tablet; Refill: 0 - amphetamine-dextroamphetamine (ADDERALL) 10 MG tablet; Take 1 tablet (10 mg total) by mouth daily.  Dispense: 30 tablet; Refill: 0  5. Hypercholesteremia On Zocor.     Richard Wendelyn BreslowGilbert Jr, MD  [facility]. I,Kathleen J Wolford,acting as a Neurosurgeonscribe for Target Corporationichard Gilbert Jr, MD.,have documented all relevant documentation on the behalf of Megan MansRichard Gilbert Jr, MD,as directed by  Megan Mansichard Gilbert Jr, MD while in the presence of Megan Mansichard Gilbert Jr, MD.

## 2018-12-05 NOTE — Progress Notes (Signed)
Patient: Antonio Whitney, Male    DOB: 24-Mar-1955, 63 y.o.   MRN: [id number] Visit Date: 12/07/2018  Today's Provider: Wilhemena Durie, MD   Chief Complaint  Patient presents with  . Annual Exam   Subjective:     Annual physical exam Antonio Whitney is a 63 y.o. male who presents today for health maintenance and complete physical. He feels well. He reports exercising daily. He reports he is sleeping well.  Colonoscopy- 03/02/16 tubular adenoma, repeat in 5 years.    Review of Systems  Constitutional: Negative.   HENT: Negative.   Eyes: Negative.   Respiratory: Positive for apnea.   Cardiovascular: Negative.   Gastrointestinal: Negative.   Endocrine: Negative.   Genitourinary: Negative.   Musculoskeletal: Negative.   Allergic/Immunologic: Negative.   Neurological: Negative.   Hematological: Negative.   Psychiatric/Behavioral: Negative.     Social History      He  reports that he has never smoked. He has never used smokeless tobacco. He reports that he does not drink alcohol or use drugs.       Social History   Socioeconomic History  . Marital status: Married    Spouse name: Vinnie Level  . Number of children: 2  . Years of education: 6  . Highest education level: Not on file  Occupational History  . Occupation: Dentist now  Social Needs  . Financial resource strain: Not on file  . Food insecurity    Worry: Not on file    Inability: Not on file  . Transportation needs    Medical: Not on file    Non-medical: Not on file  Tobacco Use  . Smoking status: Never Smoker  . Smokeless tobacco: Never Used  Substance and Sexual Activity  . Alcohol use: No  . Drug use: No  . Sexual activity: Yes  Lifestyle  . Physical activity    Days per week: Not on file    Minutes per session: Not on file  . Stress: Not on file  Relationships  . Social Herbalist on phone: Not on file    Gets together: Not on file    Attends religious service:  Not on file    Active member of club or organization: Not on file    Attends meetings of clubs or organizations: Not on file    Relationship status: Not on file  Other Topics Concern  . Not on file  Social History Narrative  . Not on file    Past Medical History:  Diagnosis Date  . Attention deficit disorder   . Diabetes ([place])   . Hyperlipidemia      Patient Active Problem List   Diagnosis Date Noted  . ADD (attention deficit disorder) 09/21/2014  . Elevated blood sugar 09/21/2014  . Gout 09/21/2014  . Hypercholesteremia 09/21/2014  . Adiposity 09/21/2014  . Apnea, sleep 09/21/2014  . Alopecia 06/17/2009  . Colon, diverticulosis 06/17/2009  . Essential (primary) hypertension 06/17/2009  . Avitaminosis D 06/17/2009    Past Surgical History:  Procedure Laterality Date  . APPENDECTOMY    . HERNIA REPAIR     inguinal left-1980    Family History        Family Status  Relation Name Status  . Mother  Deceased at age 91  . Father  Deceased at age 57       laryngeal cancer  . Brother  Alive  His family history includes Breast cancer in his mother; Lung cancer in his father.      No Known Allergies   Current Outpatient Medications:  .  allopurinol (ZYLOPRIM) 300 MG tablet, TAKE 1 TABLET(300 MG) BY MOUTH DAILY, Disp: 30 tablet, Rfl: 12 .  amLODipine-benazepril (LOTREL) 5-20 MG capsule, Take 1 capsule by mouth daily., Disp: 90 capsule, Rfl: 3 .  amphetamine-dextroamphetamine (ADDERALL) 10 MG tablet, Take 1 tablet (10 mg total) by mouth daily., Disp: 30 tablet, Rfl: 0 .  amphetamine-dextroamphetamine (ADDERALL) 10 MG tablet, Take 1 tablet (10 mg total) by mouth daily with breakfast., Disp: 30 tablet, Rfl: 0 .  amphetamine-dextroamphetamine (ADDERALL) 10 MG tablet, Take 1 tablet (10 mg total) by mouth daily., Disp: 30 tablet, Rfl: 0 .  finasteride (PROSCAR) 5 MG tablet, Take 0.5 tablets (2.5 mg total) by mouth every other day., Disp: 30 tablet, Rfl: 11 .  Inulin  (METAMUCIL CLEAR & NATURAL) POWD, Take by mouth., Disp: , Rfl:  .  metFORMIN (GLUCOPHAGE) 500 MG tablet, TAKE 1 TABLET(500 MG) BY MOUTH DAILY WITH BREAKFAST, Disp: 90 tablet, Rfl: 3 .  Multiple Vitamins-Minerals (CENTRUM SILVER 50+MEN PO), daily., Disp: , Rfl:  .  Multiple Vitamins-Minerals (CENTRUM SILVER PO), Take by mouth., Disp: , Rfl:  .  NON FORMULARY, CPAP at night, Disp: , Rfl:  .  Omega-3 Fatty Acids (FISH OIL) 1200 MG CAPS, Take by mouth., Disp: , Rfl:  .  simvastatin (ZOCOR) 20 MG tablet, Take 1 tablet (20 mg total) by mouth at bedtime., Disp: 30 tablet, Rfl: 12   Patient Care Team: Maple Hudson., MD as PCP - General (Family Medicine)    Objective:    Vitals: BP 122/74   Pulse 98   Temp 97.7 F (36.5 C)   Resp 16   Ht 5\' 7"  (1.702 m)   Wt 211 lb (95.7 kg)   SpO2 98%   BMI 33.05 kg/m    Vitals:   12/07/18 0908  BP: 122/74  Pulse: 98  Resp: 16  Temp: 97.7 F (36.5 C)  SpO2: 98%  Weight: 211 lb (95.7 kg)  Height: 5\' 7"  (1.702 m)     Physical Exam Constitutional:      Appearance: He is well-developed.  HENT:     Head: Normocephalic and atraumatic.     Right Ear: External ear normal.     Left Ear: External ear normal.     Nose: Nose normal.  Eyes:     General: No scleral icterus.    Conjunctiva/sclera: Conjunctivae normal.     Pupils: Pupils are equal, round, and reactive to light.  Neck:     Thyroid: No thyromegaly.  Cardiovascular:     Rate and Rhythm: Normal rate and regular rhythm.     Heart sounds: Normal heart sounds.  Pulmonary:     Effort: Pulmonary effort is normal.     Breath sounds: Normal breath sounds.  Genitourinary:    Penis: Normal.      Scrotum/Testes: Normal.  Lymphadenopathy:     Cervical: No cervical adenopathy.  Skin:    General: Skin is warm and dry.  Neurological:     General: No focal deficit present.     Mental Status: He is alert and oriented to person, place, and time.  Psychiatric:        Mood and Affect:  Mood normal.        Behavior: Behavior normal.        Thought Content: Thought content normal.  Judgment: Judgment normal.      Depression Screen PHQ 2/9 Scores 12/07/2018 04/07/2018 12/01/2017 10/22/2016  PHQ - 2 Score 0 0 0 0  PHQ- 9 Score - - 0 1       Assessment & Plan:     Routine Health Maintenance and Physical Exam  Exercise Activities and Dietary recommendations Goals   None     Immunization History  Administered Date(s) Administered  . Influenza,inj,Quad PF,6+ Mos 10/11/2014, 12/14/2015, 10/22/2016, 12/01/2017  . Pneumococcal Polysaccharide-23 10/22/2016  . Tdap 07/01/2011  . Zoster 10/16/2015    Health Maintenance  Topic Date Due  . HIV Screening  08/04/1970  . FOOT EXAM  04/14/2017  . OPHTHALMOLOGY EXAM  04/24/2017  . INFLUENZA VACCINE  09/10/2018  . HEMOGLOBIN A1C  02/08/2019  . COLONOSCOPY  03/02/2021  . TETANUS/TDAP  06/30/2021  . PNEUMOCOCCAL POLYSACCHARIDE VACCINE AGE 74-64 HIGH RISK  Completed  . Hepatitis C Screening  Completed     Discussed health benefits of physical activity, and encouraged him to engage in regular exercise appropriate for his age and condition.  1. Annual physical exam  - CBC with Differential/Platelet - Comprehensive metabolic panel - Lipid panel - TSH - POCT urinalysis dipstick  2. Need for influenza vaccination  - Flu Vaccine QUAD 6+ mos PF IM (Fluarix Quad PF)  3. Attention deficit disorder, unspecified hyperactivity presence Refill meds x3.  Plan to stop the use when patient retires - amphetamine-dextroamphetamine (ADDERALL) 10 MG tablet; Take 1 tablet (10 mg total) by mouth daily.  Dispense: 30 tablet; Refill: 0 - amphetamine-dextroamphetamine (ADDERALL) 10 MG tablet; Take 1 tablet (10 mg total) by mouth daily with breakfast.  Dispense: 30 tablet; Refill: 0 - amphetamine-dextroamphetamine (ADDERALL) 10 MG tablet; Take 1 tablet (10 mg total) by mouth daily.  Dispense: 30 tablet; Refill: 0  4. Prostate  cancer screening  - PSA  5. Diabetes mellitus without complication (HCC) Time for A1c.  Patient is on Lotensin - Hemoglobin A1c - POCT UA - Microalbumin--20  6. Hypercholesteremia Patient on Zocor - simvastatin (ZOCOR) 20 MG tablet; Take 1 tablet (20 mg total) by mouth at bedtime.  Dispense: 90 tablet; Refill: 3   Essica Kiker Wendelyn BreslowGilbert Jr, MD  [facility]

## 2018-12-07 ENCOUNTER — Ambulatory Visit (INDEPENDENT_AMBULATORY_CARE_PROVIDER_SITE_OTHER): Payer: BC Managed Care – PPO | Admitting: Family Medicine

## 2018-12-07 ENCOUNTER — Other Ambulatory Visit: Payer: Self-pay

## 2018-12-07 ENCOUNTER — Encounter: Payer: Self-pay | Admitting: Family Medicine

## 2018-12-07 VITALS — BP 122/74 | HR 98 | Temp 97.7°F | Resp 16 | Ht 67.0 in | Wt 211.0 lb

## 2018-12-07 DIAGNOSIS — E119 Type 2 diabetes mellitus without complications: Secondary | ICD-10-CM | POA: Diagnosis not present

## 2018-12-07 DIAGNOSIS — E7800 Pure hypercholesterolemia, unspecified: Secondary | ICD-10-CM

## 2018-12-07 DIAGNOSIS — F988 Other specified behavioral and emotional disorders with onset usually occurring in childhood and adolescence: Secondary | ICD-10-CM | POA: Diagnosis not present

## 2018-12-07 DIAGNOSIS — Z0000 Encounter for general adult medical examination without abnormal findings: Secondary | ICD-10-CM

## 2018-12-07 DIAGNOSIS — Z125 Encounter for screening for malignant neoplasm of prostate: Secondary | ICD-10-CM | POA: Diagnosis not present

## 2018-12-07 DIAGNOSIS — Z23 Encounter for immunization: Secondary | ICD-10-CM | POA: Diagnosis not present

## 2018-12-07 LAB — POCT URINALYSIS DIPSTICK
Bilirubin, UA: NEGATIVE
Blood, UA: NEGATIVE
Glucose, UA: NEGATIVE
Ketones, UA: NEGATIVE
Leukocytes, UA: NEGATIVE
Nitrite, UA: NEGATIVE
Protein, UA: NEGATIVE
Spec Grav, UA: 1.015 (ref 1.010–1.025)
Urobilinogen, UA: 0.2 E.U./dL
pH, UA: 6.5 (ref 5.0–8.0)

## 2018-12-07 LAB — POCT UA - MICROALBUMIN: Microalbumin Ur, POC: 20 mg/L

## 2018-12-07 MED ORDER — AMPHETAMINE-DEXTROAMPHETAMINE 10 MG PO TABS: 10.0000 mg | ORAL_TABLET | Freq: Every day | ORAL | 0 refills | Status: DC

## 2018-12-07 MED ORDER — SIMVASTATIN 20 MG PO TABS: 20.0000 mg | ORAL_TABLET | Freq: Every day | ORAL | 3 refills | Status: DC

## 2018-12-08 LAB — PSA: Prostate Specific Ag, Serum: 2.4 ng/mL (ref 0.0–4.0)

## 2018-12-08 LAB — COMPREHENSIVE METABOLIC PANEL
ALT: 32 IU/L (ref 0–44)
AST: 21 IU/L (ref 0–40)
Albumin/Globulin Ratio: 1.7 (ref 1.2–2.2)
Albumin: 4.4 g/dL (ref 3.8–4.8)
Alkaline Phosphatase: 77 IU/L (ref 39–117)
BUN/Creatinine Ratio: 15 (ref 10–24)
BUN: 16 mg/dL (ref 8–27)
Bilirubin Total: 0.5 mg/dL (ref 0.0–1.2)
CO2: 21 mmol/L (ref 20–29)
Calcium: 9.8 mg/dL (ref 8.6–10.2)
Chloride: 104 mmol/L (ref 96–106)
Creatinine, Ser: 1.06 mg/dL (ref 0.76–1.27)
GFR calc Af Amer: 86 mL/min/{1.73_m2} (ref >59)
GFR calc non Af Amer: 74 mL/min/{1.73_m2} (ref >59)
Globulin, Total: 2.6 g/dL (ref 1.5–4.5)
Glucose: 157 mg/dL — ABNORMAL HIGH (ref 65–99)
Potassium: 4.4 mmol/L (ref 3.5–5.2)
Sodium: 140 mmol/L (ref 134–144)
Total Protein: 7 g/dL (ref 6.0–8.5)

## 2018-12-08 LAB — CBC WITH DIFFERENTIAL/PLATELET
Basophils Absolute: 0 10*3/uL (ref 0.0–0.2)
Basos: 0 %
EOS (ABSOLUTE): 0.1 10*3/uL (ref 0.0–0.4)
Eos: 1 %
Hematocrit: 40 % (ref 37.5–51.0)
Hemoglobin: 13.8 g/dL (ref 13.0–17.7)
Immature Grans (Abs): 0 10*3/uL (ref 0.0–0.1)
Immature Granulocytes: 0 %
Lymphocytes Absolute: 1.3 10*3/uL (ref 0.7–3.1)
Lymphs: 24 %
MCH: 30.6 pg (ref 26.6–33.0)
MCHC: 34.5 g/dL (ref 31.5–35.7)
MCV: 89 fL (ref 79–97)
Monocytes Absolute: 0.5 10*3/uL (ref 0.1–0.9)
Monocytes: 9 %
Neutrophils Absolute: 3.7 10*3/uL (ref 1.4–7.0)
Neutrophils: 66 %
Platelets: 278 10*3/uL (ref 150–450)
RBC: 4.51 x10E6/uL (ref 4.14–5.80)
RDW: 12.5 % (ref 11.6–15.4)
WBC: 5.6 10*3/uL (ref 3.4–10.8)

## 2018-12-08 LAB — LIPID PANEL
Chol/HDL Ratio: 2.3 ratio (ref 0.0–5.0)
Cholesterol, Total: 159 mg/dL (ref 100–199)
HDL: 70 mg/dL (ref >39)
LDL Chol Calc (NIH): 73 mg/dL (ref 0–99)
Triglycerides: 87 mg/dL (ref 0–149)
VLDL Cholesterol Cal: 16 mg/dL (ref 5–40)

## 2018-12-08 LAB — TSH: TSH: 1.34 u[IU]/mL (ref 0.450–4.500)

## 2018-12-08 LAB — HEMOGLOBIN A1C
Est. average glucose Bld gHb Est-mCnc: 154 mg/dL
Hgb A1c MFr Bld: 7 % — ABNORMAL HIGH (ref 4.8–5.6)

## 2018-12-12 ENCOUNTER — Telehealth: Payer: Self-pay

## 2018-12-12 NOTE — Telephone Encounter (Signed)
Patient advised as directed below. 

## 2018-12-12 NOTE — Telephone Encounter (Signed)
-----   Message from Jerrol Banana., MD sent at 12/09/2018 11:28 AM EDT ----- Stable labs.

## 2018-12-28 NOTE — Progress Notes (Signed)
Patient: Antonio Whitney Male    DOB: May 08, 1955   63 y.o.   MRN: [id number] Visit Date: 12/29/2018  Today's Provider: [name], MD   No chief complaint on file.  Subjective:     HPI   Patient states he found a lump beside his rectum 4 days ago. Patient states there is no pain in that area.  Painless,no bleeding. Remote h/o hemorrhoids.Colonoscopy 2018. No Known Allergies   Current Outpatient Medications:  .  allopurinol (ZYLOPRIM) 300 MG tablet, TAKE 1 TABLET(300 MG) BY MOUTH DAILY, Disp: 30 tablet, Rfl: 12 .  amLODipine-benazepril (LOTREL) 5-20 MG capsule, Take 1 capsule by mouth daily., Disp: 90 capsule, Rfl: 3 .  amphetamine-dextroamphetamine (ADDERALL) 10 MG tablet, Take 1 tablet (10 mg total) by mouth daily., Disp: 30 tablet, Rfl: 0 .  amphetamine-dextroamphetamine (ADDERALL) 10 MG tablet, Take 1 tablet (10 mg total) by mouth daily with breakfast., Disp: 30 tablet, Rfl: 0 .  amphetamine-dextroamphetamine (ADDERALL) 10 MG tablet, Take 1 tablet (10 mg total) by mouth daily., Disp: 30 tablet, Rfl: 0 .  finasteride (PROSCAR) 5 MG tablet, Take 0.5 tablets (2.5 mg total) by mouth every other day., Disp: 30 tablet, Rfl: 11 .  Inulin (METAMUCIL CLEAR & NATURAL) POWD, Take by mouth., Disp: , Rfl:  .  metFORMIN (GLUCOPHAGE) 500 MG tablet, TAKE 1 TABLET(500 MG) BY MOUTH DAILY WITH BREAKFAST, Disp: 90 tablet, Rfl: 3 .  Multiple Vitamins-Minerals (CENTRUM SILVER 50+MEN PO), daily., Disp: , Rfl:  .  Multiple Vitamins-Minerals (CENTRUM SILVER PO), Take by mouth., Disp: , Rfl:  .  NON FORMULARY, CPAP at night, Disp: , Rfl:  .  Omega-3 Fatty Acids (FISH OIL) 1200 MG CAPS, Take by mouth., Disp: , Rfl:  .  simvastatin (ZOCOR) 20 MG tablet, Take 1 tablet (20 mg total) by mouth at bedtime., Disp: 90 tablet, Rfl: 3  Review of Systems  Constitutional: Negative for appetite change, chills and fever.  Respiratory: Negative for chest tightness, shortness of breath and wheezing.    Cardiovascular: Negative for chest pain and palpitations.  Gastrointestinal: Negative for abdominal pain, nausea and vomiting.    Social History   Tobacco Use  . Smoking status: Never Smoker  . Smokeless tobacco: Never Used  Substance Use Topics  . Alcohol use: No      Objective:   BP (!) 142/81 (BP Location: Left Arm, Patient Position: Sitting, Cuff Size: Large)   Pulse (!) 102   Temp (!) 97.5 F (36.4 C) (Other (Comment))   Resp 18   Ht 5\' 7"  (1.702 m)   Wt 212 lb (96.2 kg)   SpO2 98%   BMI 33.20 kg/m  Vitals:   12/29/18 0813  BP: (!) 142/81  Pulse: (!) 102  Resp: 18  Temp: (!) 97.5 F (36.4 C)  TempSrc: Other (Comment)  SpO2: 98%  Weight: 212 lb (96.2 kg)  Height: 5\' 7"  (1.702 m)  Body mass index is 33.2 kg/m.   Physical Exam Vitals signs reviewed.  Constitutional:      Appearance: Normal appearance.  HENT:     Head: Normocephalic and atraumatic.  Eyes:     General: No scleral icterus. Cardiovascular:     Rate and Rhythm: Normal rate and regular rhythm.     Heart sounds: Normal heart sounds.  Pulmonary:     Effort: Pulmonary effort is normal.     Breath sounds: Normal breath sounds.  Abdominal:     Palpations: Abdomen is soft.  Genitourinary:    Rectum: Guaiac result negative.     Comments: Nontender thrombosed hemorrhoid noted on right external to anal verge. Neurological:     Mental Status: He is alert.      No results found for any visits on 12/29/18.     Assessment & Plan    1. External hemorrhoid, thrombosed Sitz bath daily. Discussed natural course. - hydrocortisone (ANUSOL-HC) 2.5 % rectal cream; Place 1 application rectally 2 (two) times daily.  Dispense: 30 g; Refill: 0     Richard Cranford Mon, MD  [facility]

## 2018-12-29 ENCOUNTER — Ambulatory Visit (INDEPENDENT_AMBULATORY_CARE_PROVIDER_SITE_OTHER): Payer: BC Managed Care – PPO | Admitting: Family Medicine

## 2018-12-29 ENCOUNTER — Other Ambulatory Visit: Payer: Self-pay

## 2018-12-29 ENCOUNTER — Encounter: Payer: Self-pay | Admitting: Family Medicine

## 2018-12-29 VITALS — BP 142/81 | HR 102 | Temp 97.5°F | Resp 18 | Ht 67.0 in | Wt 212.0 lb

## 2018-12-29 DIAGNOSIS — K645 Perianal venous thrombosis: Secondary | ICD-10-CM

## 2018-12-29 MED ORDER — HYDROCORTISONE (PERIANAL) 2.5 % EX CREA: 1.0000 "application " | TOPICAL_CREAM | Freq: Two times a day (BID) | CUTANEOUS | 0 refills | Status: DC

## 2018-12-29 NOTE — Patient Instructions (Addendum)
External Hemorrhoid Thrombosis Sitz bath daily.  - hydrocortisone (ANUSOL-HC) 2.5 % rectal cream; Place 1 application rectally 2 (two) times daily.  Dispense: 30 g; Refill: 0

## 2019-02-28 ENCOUNTER — Other Ambulatory Visit: Payer: Self-pay | Admitting: Family Medicine

## 2019-02-28 DIAGNOSIS — E119 Type 2 diabetes mellitus without complications: Secondary | ICD-10-CM

## 2019-02-28 NOTE — Telephone Encounter (Signed)
Requested Prescriptions  Pending Prescriptions Disp Refills  . metFORMIN (GLUCOPHAGE) 500 MG tablet [Pharmacy Med Name: METFORMIN 500MG TABLETS] 90 tablet 3    Sig: TAKE 1 TABLET(500 MG) BY MOUTH DAILY WITH BREAKFAST     Endocrinology:  Diabetes - Biguanides Passed - 02/28/2019  3:28 AM      Passed - Cr in normal range and within 360 days    Creat  Date Value Ref Range Status  10/22/2016 1.08 0.70 - 1.25 mg/dL Final    Comment:    For patients >55 years of age, the reference limit for Creatinine is approximately 13% higher for people identified as African-American. .    Creatinine, Ser  Date Value Ref Range Status  12/07/2018 1.06 0.76 - 1.27 mg/dL Final         Passed - HBA1C is between 0 and 7.9 and within 180 days    Hgb A1c MFr Bld  Date Value Ref Range Status  12/07/2018 7.0 (H) 4.8 - 5.6 % Final    Comment:             Prediabetes: 5.7 - 6.4          Diabetes: >6.4          Glycemic control for adults with diabetes: <7.0          Passed - eGFR in normal range and within 360 days    GFR, Est African American  Date Value Ref Range Status  10/22/2016 85 > OR = 60 mL/min/1.23m Final   GFR calc Af Amer  Date Value Ref Range Status  12/07/2018 86 >59 mL/min/1.73 Final   GFR, Est Non African American  Date Value Ref Range Status  10/22/2016 74 > OR = 60 mL/min/1.728mFinal   GFR calc non Af Amer  Date Value Ref Range Status  12/07/2018 74 >59 mL/min/1.73 Final         Passed - Valid encounter within last 6 months    Recent Outpatient Visits          2 months ago External hemorrhoid, thrombosed   [facility]   2 months ago [facility] physical [facility]   [facility]   [facility]   1 year ago [facility]      Future Appointments            In 2 weeks [facility]rol Banana MD [facility]

## 2019-03-13 NOTE — Progress Notes (Signed)
Patient: Antonio Whitney Male    DOB: [birth year]   64 y.o.   MRN: [id number] Visit Date: 03/14/2019  Today's Provider: Wilhemena Durie, MD   Chief Complaint  Patient presents with  . Diabetes Mellitus   Subjective:     HPI  He is exercising 30 minutes 5 times a week but is not watching his diet as closely as he should. Patient needs refill on Adderall  Attention deficit disorder, unspecified hyperactivity presence From 12/07/2018-Refilled meds x3.  Plan to stop the use when patient retires.  Diabetes mellitus without complication ([place]) From 99/37/1696-VEL A1c 7.0.  Patient is on Lotensin  Hypercholesteremia From 12/07/2018-Patient on Zocor.   No Known Allergies   Current Outpatient Medications:  .  allopurinol (ZYLOPRIM) 300 MG tablet, TAKE 1 TABLET(300 MG) BY MOUTH DAILY, Disp: 30 tablet, Rfl: 12 .  amLODipine-benazepril (LOTREL) 5-20 MG capsule, Take 1 capsule by mouth daily., Disp: 90 capsule, Rfl: 3 .  amphetamine-dextroamphetamine (ADDERALL) 10 MG tablet, Take 1 tablet (10 mg total) by mouth daily., Disp: 30 tablet, Rfl: 0 .  amphetamine-dextroamphetamine (ADDERALL) 10 MG tablet, Take 1 tablet (10 mg total) by mouth daily with breakfast., Disp: 30 tablet, Rfl: 0 .  amphetamine-dextroamphetamine (ADDERALL) 10 MG tablet, Take 1 tablet (10 mg total) by mouth daily., Disp: 30 tablet, Rfl: 0 .  finasteride (PROSCAR) 5 MG tablet, Take 0.5 tablets (2.5 mg total) by mouth every other day., Disp: 30 tablet, Rfl: 11 .  hydrocortisone (ANUSOL-HC) 2.5 % rectal cream, Place 1 application rectally 2 (two) times daily., Disp: 30 g, Rfl: 0 .  Inulin (METAMUCIL CLEAR & NATURAL) POWD, Take by mouth., Disp: , Rfl:  .  metFORMIN (GLUCOPHAGE) 500 MG tablet, TAKE 1 TABLET(500 MG) BY MOUTH DAILY WITH BREAKFAST, Disp: 90 tablet, Rfl: 3 .  Multiple Vitamins-Minerals (CENTRUM SILVER 50+MEN PO), daily., Disp: , Rfl:  .  Multiple Vitamins-Minerals (CENTRUM SILVER PO), Take by mouth.,  Disp: , Rfl:  .  NON FORMULARY, CPAP at night, Disp: , Rfl:  .  Omega-3 Fatty Acids (FISH OIL) 1200 MG CAPS, Take by mouth., Disp: , Rfl:  .  simvastatin (ZOCOR) 20 MG tablet, Take 1 tablet (20 mg total) by mouth at bedtime., Disp: 90 tablet, Rfl: 3  Review of Systems  Constitutional: Negative for appetite change, chills and fever.  HENT: Negative.   Eyes: Negative.   Respiratory: Negative for chest tightness, shortness of breath and wheezing.   Cardiovascular: Negative for chest pain and palpitations.  Gastrointestinal: Negative for abdominal pain, nausea and vomiting.  Endocrine: Negative.   Allergic/Immunologic: Negative.   Psychiatric/Behavioral: Negative.     Social History   Tobacco Use  . Smoking status: Never Smoker  . Smokeless tobacco: Never Used  Substance Use Topics  . Alcohol use: No      Objective:   There were no vitals taken for this visit. There were no vitals filed for this visit.There is no height or weight on file to calculate BMI.   Physical Exam Vitals reviewed.  Constitutional:      Appearance: He is well-developed.  HENT:     Head: Normocephalic and atraumatic.     Right Ear: External ear normal.     Left Ear: External ear normal.     Nose: Nose normal.  Eyes:     General: No scleral icterus.    Conjunctiva/sclera: Conjunctivae normal.     Pupils: Pupils are equal, round, and reactive to light.  Neck:  Thyroid: No thyromegaly.  Cardiovascular:     Rate and Rhythm: Normal rate and regular rhythm.     Heart sounds: Normal heart sounds.  Pulmonary:     Effort: Pulmonary effort is normal.     Breath sounds: Normal breath sounds.  Genitourinary:    Penis: Normal.      Testes: Normal.  Lymphadenopathy:     Cervical: No cervical adenopathy.  Skin:    General: Skin is warm and dry.  Neurological:     General: No focal deficit present.     Mental Status: He is alert and oriented to person, place, and time.  Psychiatric:        Mood and  Affect: Mood normal.        Behavior: Behavior normal.        Thought Content: Thought content normal.        Judgment: Judgment normal.      No results found for any visits on 03/14/19.     Assessment & Plan    1. Diabetes mellitus without complication (HCC) Worse control with A1c of 7.9 today.  Dietary measures stressed.  Consider adding Jardiance.or Ozempic. - POCT HgB A1C  2. Attention deficit disorder, unspecified hyperactivity presence  - amphetamine-dextroamphetamine (ADDERALL) 10 MG tablet; Take 1 tablet (10 mg total) by mouth daily with breakfast.  Dispense: 30 tablet; Refill: 0 - amphetamine-dextroamphetamine (ADDERALL) 10 MG tablet; Take 1 tablet (10 mg total) by mouth daily.  Dispense: 30 tablet; Refill: 0 - amphetamine-dextroamphetamine (ADDERALL) 10 MG tablet; Take 1 tablet (10 mg total) by mouth daily.  Dispense: 30 tablet; Refill: 0  3. Benign prostatic hyperplasia without lower urinary tract symptoms  - finasteride (PROSCAR) 5 MG tablet; Take 0.5 tablets (2.5 mg total) by mouth every other day.  Dispense: 30 tablet; Refill: 11  4. Other sleep apnea   5. Essential (primary) hypertension Fair control. Longstanding mild tachycardia in office. 6.Obesity     Richard Wendelyn Breslow, MD  [facility]

## 2019-03-14 ENCOUNTER — Encounter: Payer: Self-pay | Admitting: Family Medicine

## 2019-03-14 ENCOUNTER — Ambulatory Visit: Payer: BC Managed Care – PPO | Admitting: Family Medicine

## 2019-03-14 ENCOUNTER — Other Ambulatory Visit: Payer: Self-pay

## 2019-03-14 VITALS — BP 143/84 | HR 108 | Temp 96.2°F | Ht 67.0 in | Wt 211.6 lb

## 2019-03-14 DIAGNOSIS — F988 Other specified behavioral and emotional disorders with onset usually occurring in childhood and adolescence: Secondary | ICD-10-CM | POA: Diagnosis not present

## 2019-03-14 DIAGNOSIS — G4739 Other sleep apnea: Secondary | ICD-10-CM | POA: Diagnosis not present

## 2019-03-14 DIAGNOSIS — N400 Enlarged prostate without lower urinary tract symptoms: Secondary | ICD-10-CM | POA: Diagnosis not present

## 2019-03-14 DIAGNOSIS — I10 Essential (primary) hypertension: Secondary | ICD-10-CM

## 2019-03-14 DIAGNOSIS — E119 Type 2 diabetes mellitus without complications: Secondary | ICD-10-CM

## 2019-03-14 LAB — POCT GLYCOSYLATED HEMOGLOBIN (HGB A1C)
Estimated Average Glucose: 180
Hemoglobin A1C: 7.9 % — AB (ref 4.0–5.6)

## 2019-03-14 MED ORDER — AMPHETAMINE-DEXTROAMPHETAMINE 10 MG PO TABS: 10.0000 mg | ORAL_TABLET | Freq: Every day | ORAL | 0 refills | Status: DC

## 2019-03-14 MED ORDER — FINASTERIDE 5 MG PO TABS: 2.5000 mg | ORAL_TABLET | ORAL | 11 refills | Status: DC

## 2019-03-27 ENCOUNTER — Encounter: Payer: Self-pay | Admitting: Family Medicine

## 2019-03-29 ENCOUNTER — Telehealth: Payer: Self-pay

## 2019-03-29 NOTE — Telephone Encounter (Signed)
Please advise 

## 2019-03-29 NOTE — Telephone Encounter (Signed)
Copied from CRM [id number]. Topic: General - Inquiry >> Mar 29, 2019  3:58 PM Floria Raveling A wrote: Reason for CRM:  03/27/19 11:09 AM This is a request for Dr. Sullivan Lone to prepare a letter for me related to Covid-19, addressing my higher risk of becoming infected based on age, high blood pressure, pre-diabetes, and any other indications. My wife works for [employer], and this support is necessary for her to request continuation of her working from home. Please reply or call my cell with questions or updates:  [phone]. Thanks. [name] [address] [place], [place]  [address]  *pt called in and stated he sent this message Monday and would like to know if dr Sullivan Lone has taken a look at this?

## 2019-03-30 ENCOUNTER — Telehealth: Payer: Self-pay

## 2019-03-30 NOTE — Telephone Encounter (Signed)
Copied from CRM [id number]. Topic: General - Other >> Mar 30, 2019  2:30 PM Gwenlyn Fudge wrote: Reason for CRM: Pt called and is requesting to have PCP nurse call him back to go over his request for a letter. Please advise.

## 2019-03-31 NOTE — Telephone Encounter (Signed)
See previous message from Dr. Sullivan Lone.

## 2019-03-31 NOTE — Telephone Encounter (Signed)
I sent him direct message reply advising him we are doing no letters to move people up the covid list.

## 2019-04-12 ENCOUNTER — Other Ambulatory Visit: Payer: Self-pay | Admitting: Family Medicine

## 2019-04-12 DIAGNOSIS — N400 Enlarged prostate without lower urinary tract symptoms: Secondary | ICD-10-CM

## 2019-06-09 NOTE — Progress Notes (Signed)
Established patient visit  I,April Miller,acting as a scribe for [name], MD.,have documented all relevant documentation on the behalf of [name], MD,as directed by  [name], MD while in the presence of [name], MD.   Patient: Antonio Whitney   DOB: [birth year]   64 y.o. Male  MRN: [id number] Visit Date: 06/13/2019  Today's healthcare provider: [name], MD   Chief Complaint  Patient presents with  . Follow-up  . Diabetes  . Hypertension   Subjective    HPI  Patient has had both vaccines and overall feels well.  He has been working hard on diet and exercise and is lost 9 pounds since his last visit. Diabetes Mellitus Type II, follow-up  Lab Results  Component Value Date   HGBA1C 7.0 (A) 06/13/2019   HGBA1C 7.9 (A) 03/14/2019   HGBA1C 7.0 (H) 12/07/2018   Last seen for diabetes 3 months ago.      Management since then includes; Worse control       with A1c of 7.9 today. Dietary measures stressed.     Consider adding Jardiance.or Ozempic. He reports good compliance with treatment. He is not having side effects. none  Home blood sugar records: fasting range: not checking  Episodes of hypoglycemia? No none   Current insulin regiment: n/a Most Recent Eye Exam: 07/2018  --------------------------------------------------------------------  Hypertension, follow-up  BP Readings from Last 3 Encounters:  06/13/19 127/76  03/14/19 (!) 143/84  12/29/18 (!) 142/81   He was last seen for hypertension 3 months ago.  BP at that visit was 143/84.     Management since that visit includes; Fair control. Longstanding mild tachycardia in office. He reports good compliance with treatment. He is not having side effects. none He is exercising. He is adherent to low salt diet.   Outside blood pressures are 130/69.  He does not smoke.  Use of agents associated with hypertension: none.    --------------------------------------------------------------------        Medications: Outpatient Medications Prior to Visit  Medication Sig  . allopurinol (ZYLOPRIM) 300 MG tablet TAKE 1 TABLET(300 MG) BY MOUTH DAILY  . amLODipine-benazepril (LOTREL) 5-20 MG capsule Take 1 capsule by mouth daily.  Marland Kitchen amphetamine-dextroamphetamine (ADDERALL) 10 MG tablet Take 1 tablet (10 mg total) by mouth daily with breakfast.  . amphetamine-dextroamphetamine (ADDERALL) 10 MG tablet Take 1 tablet (10 mg total) by mouth daily.  Marland Kitchen amphetamine-dextroamphetamine (ADDERALL) 10 MG tablet Take 1 tablet (10 mg total) by mouth daily.  . finasteride (PROSCAR) 5 MG tablet Take 0.5 tablets (2.5 mg total) by mouth every other day.  . Inulin (METAMUCIL CLEAR & NATURAL) POWD Take by mouth.  . metFORMIN (GLUCOPHAGE) 500 MG tablet TAKE 1 TABLET(500 MG) BY MOUTH DAILY WITH BREAKFAST  . Multiple Vitamins-Minerals (CENTRUM SILVER 50+MEN PO) daily.  . NON FORMULARY CPAP at night  . Omega-3 Fatty Acids (FISH OIL) 1200 MG CAPS Take by mouth.  . simvastatin (ZOCOR) 20 MG tablet Take 1 tablet (20 mg total) by mouth at bedtime.  . hydrocortisone (ANUSOL-HC) 2.5 % rectal cream Place 1 application rectally 2 (two) times daily. (Patient not taking: Reported on 03/14/2019)  . Multiple Vitamins-Minerals (CENTRUM SILVER PO) Take by mouth.   No facility-administered medications prior to visit.    Review of Systems  Constitutional: Negative for appetite change, chills and fever.  HENT: Negative.   Eyes: Negative.   Respiratory: Negative for chest tightness, shortness of breath and wheezing.  Cardiovascular: Negative for chest pain and palpitations.  Gastrointestinal: Negative for abdominal pain, nausea and vomiting.  Endocrine: Negative.   Allergic/Immunologic: Negative.   Hematological: Negative.   Psychiatric/Behavioral: Negative.        Objective    BP 127/76 (BP Location: Left Arm, Patient Position: Sitting, Cuff  Size: Large)   Pulse 85   Temp (!) 96.9 F (36.1 C) (Other (Comment))   Resp 18   Ht 5\' 7"  (1.702 m)   Wt 203 lb (92.1 kg)   SpO2 97%   BMI 31.79 kg/m  BP Readings from Last 3 Encounters:  06/13/19 127/76  03/14/19 (!) 143/84  12/29/18 (!) 142/81   Wt Readings from Last 3 Encounters:  06/13/19 203 lb (92.1 kg)  03/14/19 211 lb 9.6 oz (96 kg)  12/29/18 212 lb (96.2 kg)      Physical Exam Vitals reviewed.  Constitutional:      Appearance: He is well-developed.  HENT:     Head: Normocephalic and atraumatic.     Right Ear: External ear normal.     Left Ear: External ear normal.     Nose: Nose normal.  Eyes:     General: No scleral icterus.    Conjunctiva/sclera: Conjunctivae normal.     Pupils: Pupils are equal, round, and reactive to light.  Neck:     Thyroid: No thyromegaly.  Cardiovascular:     Rate and Rhythm: Normal rate and regular rhythm.     Heart sounds: Normal heart sounds.  Pulmonary:     Effort: Pulmonary effort is normal.     Breath sounds: Normal breath sounds.  Abdominal:     Palpations: Abdomen is soft.  Lymphadenopathy:     Cervical: No cervical adenopathy.  Skin:    General: Skin is warm and dry.  Neurological:     General: No focal deficit present.     Mental Status: He is alert and oriented to person, place, and time.     Comments: Diabetic foot exam normal  Psychiatric:        Mood and Affect: Mood normal.        Behavior: Behavior normal.        Thought Content: Thought content normal.        Judgment: Judgment normal.       Results for orders placed or performed in visit on 06/13/19  POCT glycosylated hemoglobin (Hb A1C)  Result Value Ref Range   Hemoglobin A1C 7.0 (A) 4.0 - 5.6 %   Est. average glucose Bld gHb Est-mCnc 154     Assessment & Plan     1. Diabetes mellitus without complication ([facility]) Good control with improved from 7.9 A1c to 7.0 today.  Continue Metformin - POCT glycosylated hemoglobin (Hb A1C)  2. Essential  (primary) hypertension Good control on Lotrel 5/20 - amLODipine-benazepril (LOTREL) 5-20 MG capsule; Take 1 capsule by mouth daily.  Dispense: 90 capsule; Refill: 3  3. Gout, unspecified cause, unspecified chronicity, unspecified site Follow-up uric acid, continue allopurinol - allopurinol (ZYLOPRIM) 300 MG tablet; TAKE 1 TABLET(300 MG) BY MOUTH DAILY  Dispense: 30 tablet; Refill: 12  4. Attention deficit disorder, unspecified hyperactivity presence Refill 3 scripts - amphetamine-dextroamphetamine (ADDERALL) 10 MG tablet; Take 1 tablet (10 mg total) by mouth daily with breakfast.  Dispense: 30 tablet; Refill: 0 - amphetamine-dextroamphetamine (ADDERALL) 10 MG tablet; Take 1 tablet (10 mg total) by mouth daily.  Dispense: 30 tablet; Refill: 0 - amphetamine-dextroamphetamine (ADDERALL) 10 MG tablet; Take 1 tablet (10  mg total) by mouth daily.  Dispense: 30 tablet; Refill: 0  5. Avitaminosis D   6. Hypercholesteremia On Zocor 20 mg daily   No follow-ups on file.      I, [name], MD, have reviewed all documentation for this visit. The documentation on 06/13/19 for the exam, diagnosis, procedures, and orders are all accurate and complete.    Nitin Mckowen Wendelyn Breslow, MD  [facility] [phone] (phone) [phone] (fax)  [facility]

## 2019-06-13 ENCOUNTER — Ambulatory Visit (INDEPENDENT_AMBULATORY_CARE_PROVIDER_SITE_OTHER): Payer: BC Managed Care – PPO | Admitting: Family Medicine

## 2019-06-13 ENCOUNTER — Encounter: Payer: Self-pay | Admitting: Family Medicine

## 2019-06-13 ENCOUNTER — Other Ambulatory Visit: Payer: Self-pay

## 2019-06-13 VITALS — BP 127/76 | HR 85 | Temp 96.9°F | Resp 18 | Ht 67.0 in | Wt 203.0 lb

## 2019-06-13 DIAGNOSIS — E559 Vitamin D deficiency, unspecified: Secondary | ICD-10-CM

## 2019-06-13 DIAGNOSIS — E119 Type 2 diabetes mellitus without complications: Secondary | ICD-10-CM

## 2019-06-13 DIAGNOSIS — F988 Other specified behavioral and emotional disorders with onset usually occurring in childhood and adolescence: Secondary | ICD-10-CM | POA: Diagnosis not present

## 2019-06-13 DIAGNOSIS — M109 Gout, unspecified: Secondary | ICD-10-CM | POA: Diagnosis not present

## 2019-06-13 DIAGNOSIS — I10 Essential (primary) hypertension: Secondary | ICD-10-CM | POA: Diagnosis not present

## 2019-06-13 DIAGNOSIS — E7800 Pure hypercholesterolemia, unspecified: Secondary | ICD-10-CM

## 2019-06-13 LAB — POCT GLYCOSYLATED HEMOGLOBIN (HGB A1C)
Est. average glucose Bld gHb Est-mCnc: 154
Hemoglobin A1C: 7 % — AB (ref 4.0–5.6)

## 2019-06-13 MED ORDER — AMPHETAMINE-DEXTROAMPHETAMINE 10 MG PO TABS: 10.0000 mg | ORAL_TABLET | Freq: Every day | ORAL | 0 refills | Status: DC

## 2019-06-13 MED ORDER — ALLOPURINOL 300 MG PO TABS: ORAL_TABLET | ORAL | 12 refills | Status: DC

## 2019-06-13 MED ORDER — AMLODIPINE BESY-BENAZEPRIL HCL 5-20 MG PO CAPS: 1.0000 | ORAL_CAPSULE | Freq: Every day | ORAL | 3 refills | Status: DC

## 2019-07-24 LAB — HM DIABETES EYE EXAM

## 2019-08-04 NOTE — Progress Notes (Signed)
Eye exam abstracted. 

## 2019-08-09 ENCOUNTER — Other Ambulatory Visit: Payer: Self-pay | Admitting: Family Medicine

## 2019-08-09 DIAGNOSIS — M109 Gout, unspecified: Secondary | ICD-10-CM

## 2019-08-10 ENCOUNTER — Other Ambulatory Visit: Payer: Self-pay | Admitting: Family Medicine

## 2019-08-10 DIAGNOSIS — I10 Essential (primary) hypertension: Secondary | ICD-10-CM

## 2019-09-26 ENCOUNTER — Other Ambulatory Visit: Payer: Self-pay | Admitting: Family Medicine

## 2019-09-26 DIAGNOSIS — F988 Other specified behavioral and emotional disorders with onset usually occurring in childhood and adolescence: Secondary | ICD-10-CM

## 2019-09-26 NOTE — Telephone Encounter (Signed)
Medication Refill - Medication:  amphetamine-dextroamphetamine (ADDERALL) 10 MG tablet [[id number]]    Preferred Pharmacy (with phone number or street name):  [id number] - [place], [place] - [address] AT [facility] [address] [facility] [address]  [address] [place] [address]  Phone: [phone] Fax: [phone]     Agent: Please be advised that RX refills may take up to 3 business days. We ask that you follow-up with your pharmacy.

## 2019-09-26 NOTE — Telephone Encounter (Signed)
Requested medication (s) are due for refill today: yes  Requested medication (s) are on the active medication list: yes  Last refill:  06/13/2019  Future visit scheduled: yes  Notes to clinic:  this refill cannot be delegated    Requested Prescriptions  Pending Prescriptions Disp Refills   amphetamine-dextroamphetamine (ADDERALL) 10 MG tablet 30 tablet 0    Sig: Take 1 tablet (10 mg total) by mouth daily with breakfast.      Not Delegated - Psychiatry:  Stimulants/ADHD Failed - 09/26/2019 12:02 PM      Failed - This refill cannot be delegated      Failed - Urine Drug Screen completed in last 360 days.      Failed - Valid encounter within last 3 months    Recent Outpatient Visits           3 months ago Diabetes mellitus without complication [facility])   [facility] Maple Hudson., MD   6 months ago Diabetes mellitus without complication [facility])   [facility] Maple Hudson., MD   9 months ago External hemorrhoid, thrombosed   [facility] Maple Hudson., MD   9 months ago Annual physical exam   [facility] Maple Hudson., MD   1 year ago Diabetes mellitus without complication [facility])   [facility] Maple Hudson., MD       Future Appointments             In 3 weeks Maple Hudson., MD [facility]   In 2 months Maple Hudson., MD [facility], [facility]

## 2019-09-27 MED ORDER — AMPHETAMINE-DEXTROAMPHETAMINE 10 MG PO TABS: 10.0000 mg | ORAL_TABLET | Freq: Every day | ORAL | 0 refills | Status: DC

## 2019-10-02 ENCOUNTER — Telehealth: Payer: Self-pay

## 2019-10-02 NOTE — Telephone Encounter (Signed)
Patient requesting Adderall medication be sent in again, it went to print instead of going to the pharmacy

## 2019-10-03 ENCOUNTER — Other Ambulatory Visit: Payer: Self-pay | Admitting: Family Medicine

## 2019-10-03 DIAGNOSIS — F988 Other specified behavioral and emotional disorders with onset usually occurring in childhood and adolescence: Secondary | ICD-10-CM

## 2019-10-03 MED ORDER — AMPHETAMINE-DEXTROAMPHETAMINE 10 MG PO TABS: 10.0000 mg | ORAL_TABLET | Freq: Every day | ORAL | 0 refills | Status: DC

## 2019-10-12 NOTE — Progress Notes (Signed)
Antonio Whitney,acting as a scribe for Antonio Mans, MD.,have documented all relevant documentation on the behalf of Antonio Mans, MD,as directed by  Antonio Mans, MD while in the presence of Antonio Mans, MD.  DUE FOR FOOT EXAM  Established patient visit   Patient: Antonio Whitney   DOB: [birth year]   64 y.o. Male  MRN: [id number] Visit Date: 10/17/2019  Today's healthcare provider: [name], MD   Chief Complaint  Patient presents with  . Diabetes  . Hyperlipidemia  . Hypertension   Subjective    HPI  Patient is doing well.  He continues to exercise regularly but is not eating as well as he has been. Continues to work and still needs Adderall for his work days. Diabetes Mellitus Type II, follow-up  Lab Results  Component Value Date   HGBA1C 7.5 (A) 10/17/2019   HGBA1C 7.0 (A) 06/13/2019   HGBA1C 7.9 (A) 03/14/2019   Last seen for diabetes 4 months ago.  Management since then includes; Good control with improved from 7.9 A1c to 7.0 today.  Continue Metformin. He reports excellent compliance with treatment. He is not having side effects.   Home blood sugar records: PATIENT DOES NOT CHECK Blood Sugar AT HOME  Episodes of hypoglycemia? No    Current insulin regiment: NONE Most Recent Eye Exam: 07/24/19  --------------------------------------------------------------------------------------------------- Hypertension, follow-up  BP Readings from Last 3 Encounters:  10/17/19 125/80  06/13/19 127/76  03/14/19 (!) 143/84   Wt Readings from Last 3 Encounters:  10/17/19 207 lb 3.2 oz (94 kg)  06/13/19 203 lb (92.1 kg)  03/14/19 211 lb 9.6 oz (96 kg)     He was last seen for hypertension 4 months ago.  BP at that visit was 127/76. Management since that visit includes; Good control on Lotrel 5/20. He reports excellent compliance with treatment. He is not having side effects.  He is exercising. He is adherent to low salt diet.    Outside blood pressures are being checked.  He does not smoke.  Use of agents associated with hypertension: none.   --------------------------------------------------------------------------------------------------- Lipid/Cholesterol, follow-up  Last Lipid Panel: Lab Results  Component Value Date   CHOL 159 12/07/2018   LDLCALC 73 12/07/2018   HDL 70 12/07/2018   TRIG 87 12/07/2018    He was last seen for this 4 months ago.  Management since that visit includes; On Zocor 20 mg daily. He reports excellent compliance with treatment. He is not having side effects. He is following a Low fat diet. Current exercise: walking  Last metabolic panel Lab Results  Component Value Date   GLUCOSE 157 (H) 12/07/2018   NA 140 12/07/2018   K 4.4 12/07/2018   BUN 16 12/07/2018   CREATININE 1.06 12/07/2018   GFRNONAA 74 12/07/2018   GFRAA 86 12/07/2018   CALCIUM 9.8 12/07/2018   AST 21 12/07/2018   ALT 32 12/07/2018   The 10-year ASCVD risk score Antonio George DC Jr., et al., 2013) is: 16.9%  ---------------------------------------------------------------------------------------------------  Gout, unspecified cause, unspecified chronicity, unspecified site From 06/13/2019-Follow-up uric acid, continue allopurinol.  Attention deficit disorder, unspecified hyperactivity presence From 06/13/2019-Refill 3 scripts.  Social History   Tobacco Use  . Smoking status: Never Smoker  . Smokeless tobacco: Never Used  Vaping Use  . Vaping Use: Never used  Substance Use Topics  . Alcohol use: No  . Drug use: No       Medications: Outpatient Medications Prior to Visit  Medication Sig  .  allopurinol (ZYLOPRIM) 300 MG tablet TAKE 1 TABLET(300 MG) BY MOUTH DAILY  . amLODipine-benazepril (LOTREL) 5-20 MG capsule TAKE 1 CAPSULE BY MOUTH DAILY  . finasteride (PROSCAR) 5 MG tablet Take 0.5 tablets (2.5 mg total) by mouth every other day.  . Inulin (METAMUCIL CLEAR & NATURAL) POWD Take by mouth.   . metFORMIN (GLUCOPHAGE) 500 MG tablet TAKE 1 TABLET(500 MG) BY MOUTH DAILY WITH BREAKFAST  . Multiple Vitamins-Minerals (CENTRUM SILVER 50+MEN PO) daily.  . NON FORMULARY CPAP at night  . Omega-3 Fatty Acids (FISH OIL) 1200 MG CAPS Take by mouth.  . [DISCONTINUED] amphetamine-dextroamphetamine (ADDERALL) 10 MG tablet Take 1 tablet (10 mg total) by mouth daily.  . [DISCONTINUED] amphetamine-dextroamphetamine (ADDERALL) 10 MG tablet Take 1 tablet (10 mg total) by mouth daily with breakfast.  . [DISCONTINUED] amphetamine-dextroamphetamine (ADDERALL) 10 MG tablet Take 1 tablet (10 mg total) by mouth daily.  . [DISCONTINUED] simvastatin (ZOCOR) 20 MG tablet Take 1 tablet (20 mg total) by mouth at bedtime.  . hydrocortisone (ANUSOL-HC) 2.5 % rectal cream Place 1 application rectally 2 (two) times daily. (Patient not taking: Reported on 03/14/2019)  . [DISCONTINUED] Multiple Vitamins-Minerals (CENTRUM SILVER PO) Take by mouth. (Patient not taking: Reported on 10/17/2019)   No facility-administered medications prior to visit.    Review of Systems  Last hemoglobin A1c Lab Results  Component Value Date   HGBA1C 7.5 (A) 10/17/2019      Objective    BP 125/80 (BP Location: Left Arm, Patient Position: Sitting, Cuff Size: Large)   Pulse 91   Temp 97.7 F (36.5 C) (Oral)   Wt 207 lb 3.2 oz (94 kg)   BMI 32.45 kg/m  BP Readings from Last 3 Encounters:  10/17/19 125/80  06/13/19 127/76  03/14/19 (!) 143/84   Wt Readings from Last 3 Encounters:  10/17/19 207 lb 3.2 oz (94 kg)  06/13/19 203 lb (92.1 kg)  03/14/19 211 lb 9.6 oz (96 kg)      Physical Exam Vitals reviewed.  Constitutional:      Appearance: Normal appearance.  HENT:     Head: Normocephalic and atraumatic.     Right Ear: External ear normal.     Left Ear: External ear normal.  Eyes:     General: No scleral icterus.    Conjunctiva/sclera: Conjunctivae normal.  Cardiovascular:     Rate and Rhythm: Normal rate and regular  rhythm.     Pulses: Normal pulses.     Heart sounds: Normal heart sounds.  Pulmonary:     Effort: Pulmonary effort is normal.     Breath sounds: Normal breath sounds.  Musculoskeletal:     Right lower leg: No edema.     Left lower leg: No edema.  Skin:    General: Skin is warm and dry.  Neurological:     General: No focal deficit present.     Mental Status: He is alert and oriented to person, place, and time.  Psychiatric:        Mood and Affect: Mood normal.        Behavior: Behavior normal.        Thought Content: Thought content normal.        Judgment: Judgment normal.       Results for orders placed or performed in visit on 10/17/19  POCT glycosylated hemoglobin (Hb A1C)  Result Value Ref Range   Hemoglobin A1C 7.5 (A) 4.0 - 5.6 %   Estimated Average Glucose 169     Assessment &  Plan     1. Diabetes mellitus without complication (HCC) A1c is 7.5 today which is little bit higher.  Goal A1c less than 7. - POCT glycosylated hemoglobin (Hb A1C) Diabetic foot exam on next visit. 2. Essential (primary) hypertension Controlled on amlodipine and benazepril  3. Hypercholesteremia On simvastatin with last LDL of 73 - simvastatin (ZOCOR) 20 MG tablet; Take 1 tablet (20 mg total) by mouth at bedtime.  Dispense: 90 tablet; Refill: 3  4. Attention deficit disorder, unspecified hyperactivity presence Refill Adderall x3.  We will send it directly to the pharmacy now I want to avoid confusion.  See him back in 4 months. - amphetamine-dextroamphetamine (ADDERALL) 10 MG tablet; Take 1 tablet (10 mg total) by mouth daily.  Dispense: 30 tablet; Refill: 0 - amphetamine-dextroamphetamine (ADDERALL) 10 MG tablet; Take 1 tablet (10 mg total) by mouth daily with breakfast.  Dispense: 30 tablet; Refill: 0 - amphetamine-dextroamphetamine (ADDERALL) 10 MG tablet; Take 1 tablet (10 mg total) by mouth daily.  Dispense: 30 tablet; Refill: 0   Return in about 2 months (around 12/17/2019).          Kirin Brandenburger Wendelyn Breslow, MD  [facility] [phone] (phone) [phone] (fax)  [facility]

## 2019-10-13 DIAGNOSIS — E119 Type 2 diabetes mellitus without complications: Secondary | ICD-10-CM | POA: Insufficient documentation

## 2019-10-13 DIAGNOSIS — E1165 Type 2 diabetes mellitus with hyperglycemia: Secondary | ICD-10-CM | POA: Insufficient documentation

## 2019-10-17 ENCOUNTER — Ambulatory Visit: Payer: BC Managed Care – PPO | Admitting: Family Medicine

## 2019-10-17 ENCOUNTER — Other Ambulatory Visit: Payer: Self-pay

## 2019-10-17 ENCOUNTER — Encounter: Payer: Self-pay | Admitting: Family Medicine

## 2019-10-17 VITALS — BP 125/80 | HR 91 | Temp 97.7°F | Wt 207.2 lb

## 2019-10-17 DIAGNOSIS — F988 Other specified behavioral and emotional disorders with onset usually occurring in childhood and adolescence: Secondary | ICD-10-CM

## 2019-10-17 DIAGNOSIS — E119 Type 2 diabetes mellitus without complications: Secondary | ICD-10-CM | POA: Diagnosis not present

## 2019-10-17 DIAGNOSIS — E7800 Pure hypercholesterolemia, unspecified: Secondary | ICD-10-CM | POA: Diagnosis not present

## 2019-10-17 DIAGNOSIS — I10 Essential (primary) hypertension: Secondary | ICD-10-CM

## 2019-10-17 LAB — POCT GLYCOSYLATED HEMOGLOBIN (HGB A1C)
Estimated Average Glucose: 169
Hemoglobin A1C: 7.5 % — AB (ref 4.0–5.6)

## 2019-10-17 MED ORDER — AMPHETAMINE-DEXTROAMPHETAMINE 10 MG PO TABS: 10.0000 mg | ORAL_TABLET | Freq: Every day | ORAL | 0 refills | Status: DC

## 2019-10-17 MED ORDER — SIMVASTATIN 20 MG PO TABS: 20.0000 mg | ORAL_TABLET | Freq: Every day | ORAL | 3 refills | Status: DC

## 2019-11-27 ENCOUNTER — Other Ambulatory Visit: Payer: Self-pay | Admitting: Family Medicine

## 2019-11-27 DIAGNOSIS — E7800 Pure hypercholesterolemia, unspecified: Secondary | ICD-10-CM

## 2019-12-11 NOTE — Progress Notes (Signed)
I,April Miller,acting as a scribe for [name], MD.,have documented all relevant documentation on the behalf of [name], MD,as directed by  [name], MD while in the presence of [name], MD.   Complete physical exam   Patient: Antonio Whitney   DOB: October 27, 1955   64 y.o. Male  MRN: [id number] Visit Date: 12/12/2019  Today's healthcare provider: [name], MD   Chief Complaint  Patient presents with  . Annual Exam   Subjective    Antonio Whitney is a 64 y.o. male who presents today for a complete physical exam.  He reports consuming a general diet. Exercises daily He generally feels well. He reports sleeping well. He does not have additional problems to discuss today.  Patient has no complaints.  He exercises regularly.  He wishes to lose some weight through dietary changes. HPI    Past Medical History:  Diagnosis Date  . Attention deficit disorder   . Diabetes (HCC)   . Hyperlipidemia    Past Surgical History:  Procedure Laterality Date  . APPENDECTOMY    . HERNIA REPAIR     inguinal left-1980   Social History   Socioeconomic History  . Marital status: Married    Spouse name: [name]  . Number of children: 2  . Years of education: 76  . Highest education level: Not on file  Occupational History  . Occupation: Art gallery manager now  Tobacco Use  . Smoking status: Never Smoker  . Smokeless tobacco: Never Used  Vaping Use  . Vaping Use: Never used  Substance and Sexual Activity  . Alcohol use: No  . Drug use: No  . Sexual activity: Yes  Other Topics Concern  . Not on file  Social History Narrative  . Not on file   Social Determinants of Health   Financial Resource Strain:   . Difficulty of Paying Living Expenses: Not on file  Food Insecurity:   . Worried About Programme researcher, broadcasting/film/video in the Last Year: Not on file  . Ran Out of Food in the Last Year: Not on file  Transportation Needs:   . Lack of  Transportation (Medical): Not on file  . Lack of Transportation (Non-Medical): Not on file  Physical Activity:   . Days of Exercise per Week: Not on file  . Minutes of Exercise per Session: Not on file  Stress:   . Feeling of Stress : Not on file  Social Connections:   . Frequency of Communication with Friends and Family: Not on file  . Frequency of Social Gatherings with Friends and Family: Not on file  . Attends Religious Services: Not on file  . Active Member of Clubs or Organizations: Not on file  . Attends Banker Meetings: Not on file  . Marital Status: Not on file  Intimate Partner Violence:   . Fear of Current or Ex-Partner: Not on file  . Emotionally Abused: Not on file  . Physically Abused: Not on file  . Sexually Abused: Not on file   Family Status  Relation Name Status  . Mother  Deceased at age 51  . Father  Deceased at age 22       laryngeal cancer  . Brother  Alive   Family History  Problem Relation Age of Onset  . Breast cancer Mother   . Lung cancer Father    No Known Allergies  Patient Care Team: Maple Hudson., MD as  PCP - General (Family Medicine)   Medications: Outpatient Medications Prior to Visit  Medication Sig  . allopurinol (ZYLOPRIM) 300 MG tablet TAKE 1 TABLET(300 MG) BY MOUTH DAILY  . amLODipine-benazepril (LOTREL) 5-20 MG capsule TAKE 1 CAPSULE BY MOUTH DAILY  . amphetamine-dextroamphetamine (ADDERALL) 10 MG tablet Take 1 tablet (10 mg total) by mouth daily.  Marland Kitchen amphetamine-dextroamphetamine (ADDERALL) 10 MG tablet Take 1 tablet (10 mg total) by mouth daily with breakfast.  . amphetamine-dextroamphetamine (ADDERALL) 10 MG tablet Take 1 tablet (10 mg total) by mouth daily.  . finasteride (PROSCAR) 5 MG tablet Take 0.5 tablets (2.5 mg total) by mouth every other day.  . Inulin (METAMUCIL CLEAR & NATURAL) POWD Take by mouth.  . metFORMIN (GLUCOPHAGE) 500 MG tablet TAKE 1 TABLET(500 MG) BY MOUTH DAILY WITH BREAKFAST  .  Multiple Vitamins-Minerals (CENTRUM SILVER 50+MEN PO) daily.  . NON FORMULARY CPAP at night  . Omega-3 Fatty Acids (FISH OIL) 1200 MG CAPS Take by mouth.  . simvastatin (ZOCOR) 20 MG tablet TAKE 1 TABLET(20 MG) BY MOUTH AT BEDTIME  . hydrocortisone (ANUSOL-HC) 2.5 % rectal cream Place 1 application rectally 2 (two) times daily. (Patient not taking: Reported on 03/14/2019)   No facility-administered medications prior to visit.    Review of Systems  Constitutional: Negative.   HENT: Negative.   Eyes: Negative.   Respiratory: Negative.   Cardiovascular: Negative.   Gastrointestinal: Negative.   Endocrine: Negative.   Genitourinary: Negative.   Musculoskeletal: Negative.   Skin: Negative.   Allergic/Immunologic: Negative.   Neurological: Negative.   Hematological: Negative.   Psychiatric/Behavioral: Negative.   All other systems reviewed and are negative.      Objective    BP 126/77 (BP Location: Right Arm, Patient Position: Sitting, Cuff Size: Normal)   Pulse (!) 103   Temp 97.7 F (36.5 C) (Oral)   Wt 206 lb (93.4 kg)   SpO2 99%   BMI 32.26 kg/m     Physical Exam Constitutional:      Appearance: Normal appearance. He is normal weight.  HENT:     Head: Normocephalic and atraumatic.     Right Ear: Tympanic membrane, ear canal and external ear normal.     Left Ear: Tympanic membrane, ear canal and external ear normal.     Nose: Nose normal.     Mouth/Throat:     Mouth: Mucous membranes are moist.     Pharynx: Oropharynx is clear.  Eyes:     Extraocular Movements: Extraocular movements intact.     Conjunctiva/sclera: Conjunctivae normal.     Pupils: Pupils are equal, round, and reactive to light.  Cardiovascular:     Rate and Rhythm: Normal rate and regular rhythm.     Pulses: Normal pulses.     Heart sounds: Normal heart sounds.  Pulmonary:     Effort: Pulmonary effort is normal.     Breath sounds: Normal breath sounds.  Abdominal:     General: Abdomen is flat.  Bowel sounds are normal.     Palpations: Abdomen is soft.  Genitourinary:    Penis: Normal.      Testes: Normal.     Prostate: Normal.     Rectum: Normal.  Musculoskeletal:     Cervical back: Normal range of motion and neck supple.  Skin:    General: Skin is warm and dry.  Neurological:     General: No focal deficit present.     Mental Status: He is alert and oriented to person, place,  and time. Mental status is at baseline.  Psychiatric:        Mood and Affect: Mood normal.        Behavior: Behavior normal.        Thought Content: Thought content normal.        Judgment: Judgment normal.       Last depression screening scores PHQ 2/9 Scores 12/12/2019 12/07/2018 04/07/2018  PHQ - 2 Score 0 0 0  PHQ- 9 Score 0 - -   Last fall risk screening Fall Risk  12/12/2019  Falls in the past year? 0  Number falls in past yr: 0  Injury with Fall? 0  Follow up -   Last Audit-C alcohol use screening Alcohol Use Disorder Test (AUDIT) 12/12/2019  1. How often do you have a drink containing alcohol? 0  2. How many drinks containing alcohol do you have on a typical day when you are drinking? 0  3. How often do you have six or more drinks on one occasion? 0  AUDIT-C Score 0  Alcohol Brief Interventions/Follow-up AUDIT Score <7 follow-up not indicated   A score of 3 or more in women, and 4 or more in men indicates increased risk for alcohol abuse, EXCEPT if all of the points are from question 1   No results found for any visits on 12/12/19.  Assessment & Plan    Routine Health Maintenance and Physical Exam  Exercise Activities and Dietary recommendations Goals   None     Immunization History  Administered Date(s) Administered  . Influenza,inj,Quad PF,6+ Mos 10/11/2014, 12/14/2015, 10/22/2016, 12/01/2017, 12/07/2018  . Pneumococcal Polysaccharide-23 10/22/2016  . Tdap 07/01/2011  . Zoster 10/16/2015    Health Maintenance  Topic Date Due  . COVID-19 Vaccine (1) Never done  .  HIV Screening  Never done  . INFLUENZA VACCINE  09/10/2019  . HEMOGLOBIN A1C  04/15/2020  . OPHTHALMOLOGY EXAM  07/23/2020  . FOOT EXAM  12/11/2020  . COLONOSCOPY  03/02/2021  . TETANUS/TDAP  06/30/2021  . Hepatitis C Screening  Completed    Discussed health benefits of physical activity, and encouraged him to engage in regular exercise appropriate for his age and condition.  1. Annual physical exam  - Lipid panel - TSH - CBC w/Diff/Platelet - Comprehensive Metabolic Panel (CMET) - PSA - Flu Vaccine QUAD 6+ mos PF IM (Fluarix Quad PF)  2. Essential (primary) hypertension Good control.  3. Diabetes mellitus without complication (HCC) Refill Metformin - metFORMIN (GLUCOPHAGE) 500 MG tablet; TAKE 1 TABLET(500 MG) BY MOUTH DAILY WITH BREAKFAST  Dispense: 90 tablet; Refill: 3 - POCT UA - Microalbumin  4. Hypercholesteremia   5. Prostate cancer screening   6. Need for influenza vaccination  - Flu Vaccine QUAD 6+ mos PF IM (Fluarix Quad PF)  7. Attention deficit disorder, unspecified hyperactivity presence  - amphetamine-dextroamphetamine (ADDERALL) 10 MG tablet; Take 1 tablet (10 mg total) by mouth daily.  Dispense: 30 tablet; Refill: 0 - amphetamine-dextroamphetamine (ADDERALL) 10 MG tablet; Take 1 tablet (10 mg total) by mouth daily with breakfast.  Dispense: 30 tablet; Refill: 0 - amphetamine-dextroamphetamine (ADDERALL) 10 MG tablet; Take 1 tablet (10 mg total) by mouth daily.  Dispense: 30 tablet; Refill: 0  8. Screening for blood or protein in urine  - POCT urinalysis dipstick   No follow-ups on file.     I, [name], MD, have reviewed all documentation for this visit. The documentation on 12/17/19 for the exam, diagnosis, procedures, and orders are  all accurate and complete.    Derisha Funderburke Cranford Mon, MD  [facility] [phone] (phone) [phone] (fax)  [place]

## 2019-12-12 ENCOUNTER — Ambulatory Visit (INDEPENDENT_AMBULATORY_CARE_PROVIDER_SITE_OTHER): Payer: BC Managed Care – PPO | Admitting: Family Medicine

## 2019-12-12 ENCOUNTER — Encounter: Payer: Self-pay | Admitting: Family Medicine

## 2019-12-12 ENCOUNTER — Other Ambulatory Visit: Payer: Self-pay

## 2019-12-12 VITALS — BP 126/77 | HR 103 | Temp 97.7°F | Wt 206.0 lb

## 2019-12-12 DIAGNOSIS — Z1389 Encounter for screening for other disorder: Secondary | ICD-10-CM | POA: Diagnosis not present

## 2019-12-12 DIAGNOSIS — Z23 Encounter for immunization: Secondary | ICD-10-CM | POA: Diagnosis not present

## 2019-12-12 DIAGNOSIS — E119 Type 2 diabetes mellitus without complications: Secondary | ICD-10-CM

## 2019-12-12 DIAGNOSIS — I10 Essential (primary) hypertension: Secondary | ICD-10-CM

## 2019-12-12 DIAGNOSIS — Z125 Encounter for screening for malignant neoplasm of prostate: Secondary | ICD-10-CM

## 2019-12-12 DIAGNOSIS — Z0000 Encounter for general adult medical examination without abnormal findings: Secondary | ICD-10-CM | POA: Diagnosis not present

## 2019-12-12 DIAGNOSIS — F988 Other specified behavioral and emotional disorders with onset usually occurring in childhood and adolescence: Secondary | ICD-10-CM

## 2019-12-12 DIAGNOSIS — E7800 Pure hypercholesterolemia, unspecified: Secondary | ICD-10-CM

## 2019-12-12 LAB — POCT URINALYSIS DIPSTICK
Bilirubin, UA: NEGATIVE
Blood, UA: NEGATIVE
Glucose, UA: NEGATIVE
Ketones, UA: NEGATIVE
Leukocytes, UA: NEGATIVE
Nitrite, UA: NEGATIVE
Protein, UA: NEGATIVE
Spec Grav, UA: 1.01 (ref 1.010–1.025)
Urobilinogen, UA: 0.2 E.U./dL
pH, UA: 6.5 (ref 5.0–8.0)

## 2019-12-12 LAB — POCT UA - MICROALBUMIN: Microalbumin Ur, POC: 20 mg/L

## 2019-12-12 MED ORDER — AMPHETAMINE-DEXTROAMPHETAMINE 10 MG PO TABS: 10.0000 mg | ORAL_TABLET | Freq: Every day | ORAL | 0 refills | Status: DC

## 2019-12-12 MED ORDER — METFORMIN HCL 500 MG PO TABS: ORAL_TABLET | ORAL | 3 refills | Status: DC

## 2019-12-13 LAB — CBC WITH DIFFERENTIAL/PLATELET
Basophils Absolute: 0 10*3/uL (ref 0.0–0.2)
Basos: 0 %
EOS (ABSOLUTE): 0.1 10*3/uL (ref 0.0–0.4)
Eos: 1 %
Hematocrit: 40.6 % (ref 37.5–51.0)
Hemoglobin: 13.7 g/dL (ref 13.0–17.7)
Immature Grans (Abs): 0 10*3/uL (ref 0.0–0.1)
Immature Granulocytes: 0 %
Lymphocytes Absolute: 1.3 10*3/uL (ref 0.7–3.1)
Lymphs: 23 %
MCH: 30.4 pg (ref 26.6–33.0)
MCHC: 33.7 g/dL (ref 31.5–35.7)
MCV: 90 fL (ref 79–97)
Monocytes Absolute: 0.5 10*3/uL (ref 0.1–0.9)
Monocytes: 9 %
Neutrophils Absolute: 3.6 10*3/uL (ref 1.4–7.0)
Neutrophils: 67 %
Platelets: 268 10*3/uL (ref 150–450)
RBC: 4.5 x10E6/uL (ref 4.14–5.80)
RDW: 12.2 % (ref 11.6–15.4)
WBC: 5.5 10*3/uL (ref 3.4–10.8)

## 2019-12-13 LAB — COMPREHENSIVE METABOLIC PANEL
ALT: 29 IU/L (ref 0–44)
AST: 22 IU/L (ref 0–40)
Albumin/Globulin Ratio: 1.4 (ref 1.2–2.2)
Albumin: 4.3 g/dL (ref 3.8–4.8)
Alkaline Phosphatase: 78 IU/L (ref 44–121)
BUN/Creatinine Ratio: 14 (ref 10–24)
BUN: 17 mg/dL (ref 8–27)
Bilirubin Total: 0.5 mg/dL (ref 0.0–1.2)
CO2: 21 mmol/L (ref 20–29)
Calcium: 9.9 mg/dL (ref 8.6–10.2)
Chloride: 102 mmol/L (ref 96–106)
Creatinine, Ser: 1.24 mg/dL (ref 0.76–1.27)
GFR calc Af Amer: 71 mL/min/{1.73_m2} (ref >59)
GFR calc non Af Amer: 61 mL/min/{1.73_m2} (ref >59)
Globulin, Total: 3.1 g/dL (ref 1.5–4.5)
Glucose: 205 mg/dL — ABNORMAL HIGH (ref 65–99)
Potassium: 4.4 mmol/L (ref 3.5–5.2)
Sodium: 141 mmol/L (ref 134–144)
Total Protein: 7.4 g/dL (ref 6.0–8.5)

## 2019-12-13 LAB — LIPID PANEL
Chol/HDL Ratio: 2.3 ratio (ref 0.0–5.0)
Cholesterol, Total: 174 mg/dL (ref 100–199)
HDL: 77 mg/dL (ref >39)
LDL Chol Calc (NIH): 78 mg/dL (ref 0–99)
Triglycerides: 110 mg/dL (ref 0–149)
VLDL Cholesterol Cal: 19 mg/dL (ref 5–40)

## 2019-12-13 LAB — PSA: Prostate Specific Ag, Serum: 2.1 ng/mL (ref 0.0–4.0)

## 2019-12-13 LAB — TSH: TSH: 1.48 u[IU]/mL (ref 0.450–4.500)

## 2020-02-14 ENCOUNTER — Other Ambulatory Visit: Payer: Self-pay | Admitting: Family Medicine

## 2020-02-14 DIAGNOSIS — F988 Other specified behavioral and emotional disorders with onset usually occurring in childhood and adolescence: Secondary | ICD-10-CM

## 2020-02-14 NOTE — Telephone Encounter (Signed)
Requested medication (s) are due for refill today: yes  Requested medication (s) are on the active medication list: yes  Last refill:  01/13/20  #30  0  Future visit scheduled: yes  Notes to clinic: Medication not delegated    Requested Prescriptions  Pending Prescriptions Disp Refills   amphetamine-dextroamphetamine (ADDERALL) 10 MG tablet [Pharmacy Med Name: D-AMPHETAMINE SALT COMBO 10MG  TAB] 30 tablet     Sig: TAKE 1 TABLET(10 MG) BY MOUTH DAILY WITH BREAKFAST      Not Delegated - Psychiatry:  Stimulants/ADHD Failed - 02/14/2020  1:13 PM      Failed - This refill cannot be delegated      Failed - Urine Drug Screen completed in last 360 days      Passed - Valid encounter within last 3 months    Recent Outpatient Visits           2 months ago Annual physical exam   [facility]   4 months ago Diabetes mellitus without complication [facility])   [facility] MD   8 months ago Diabetes mellitus without complication [facility])   [facility] MD   11 months ago Diabetes mellitus without complication [facility])   [facility] MD   1 year ago External hemorrhoid, thrombosed   [facility] MD       Future Appointments             In 1 month Maple Hudson., MD [facility]

## 2020-02-22 ENCOUNTER — Other Ambulatory Visit: Payer: Self-pay | Admitting: Family Medicine

## 2020-02-22 DIAGNOSIS — E119 Type 2 diabetes mellitus without complications: Secondary | ICD-10-CM

## 2020-04-10 ENCOUNTER — Ambulatory Visit (INDEPENDENT_AMBULATORY_CARE_PROVIDER_SITE_OTHER): Payer: BC Managed Care – PPO | Admitting: Family Medicine

## 2020-04-10 ENCOUNTER — Other Ambulatory Visit: Payer: Self-pay

## 2020-04-10 ENCOUNTER — Encounter: Payer: Self-pay | Admitting: Family Medicine

## 2020-04-10 VITALS — BP 117/77 | HR 103 | Resp 16 | Ht 67.0 in | Wt 206.0 lb

## 2020-04-10 DIAGNOSIS — M109 Gout, unspecified: Secondary | ICD-10-CM

## 2020-04-10 DIAGNOSIS — E119 Type 2 diabetes mellitus without complications: Secondary | ICD-10-CM

## 2020-04-10 DIAGNOSIS — F988 Other specified behavioral and emotional disorders with onset usually occurring in childhood and adolescence: Secondary | ICD-10-CM

## 2020-04-10 DIAGNOSIS — N400 Enlarged prostate without lower urinary tract symptoms: Secondary | ICD-10-CM | POA: Diagnosis not present

## 2020-04-10 DIAGNOSIS — I10 Essential (primary) hypertension: Secondary | ICD-10-CM | POA: Diagnosis not present

## 2020-04-10 DIAGNOSIS — E7800 Pure hypercholesterolemia, unspecified: Secondary | ICD-10-CM

## 2020-04-10 LAB — POCT GLYCOSYLATED HEMOGLOBIN (HGB A1C)
Est. average glucose Bld gHb Est-mCnc: 286
Hemoglobin A1C: 11.6 % — AB (ref 4.0–5.6)

## 2020-04-10 MED ORDER — AMLODIPINE BESY-BENAZEPRIL HCL 5-20 MG PO CAPS: 1.0000 | ORAL_CAPSULE | Freq: Every day | ORAL | 3 refills | Status: DC

## 2020-04-10 MED ORDER — BLOOD GLUCOSE METER KIT: PACK | 0 refills | Status: AC

## 2020-04-10 MED ORDER — DAPAGLIFLOZIN PROPANEDIOL 10 MG PO TABS: 10.0000 mg | ORAL_TABLET | Freq: Every day | ORAL | 3 refills | Status: DC

## 2020-04-10 MED ORDER — AMPHETAMINE-DEXTROAMPHETAMINE 10 MG PO TABS: 10.0000 mg | ORAL_TABLET | Freq: Every day | ORAL | 0 refills | Status: DC

## 2020-04-10 MED ORDER — SIMVASTATIN 20 MG PO TABS: ORAL_TABLET | ORAL | 3 refills | Status: DC

## 2020-04-10 MED ORDER — AMPHETAMINE-DEXTROAMPHETAMINE 10 MG PO TABS: ORAL_TABLET | ORAL | 0 refills | Status: DC

## 2020-04-10 MED ORDER — FINASTERIDE 5 MG PO TABS: 2.5000 mg | ORAL_TABLET | ORAL | 11 refills | Status: DC

## 2020-04-10 MED ORDER — METFORMIN HCL 500 MG PO TABS: 500.0000 mg | ORAL_TABLET | Freq: Every day | ORAL | 3 refills | Status: DC

## 2020-04-10 MED ORDER — ALLOPURINOL 300 MG PO TABS: ORAL_TABLET | ORAL | 12 refills | Status: DC

## 2020-04-10 NOTE — Patient Instructions (Signed)
Check blood sugars every morning before breakfast

## 2020-04-10 NOTE — Progress Notes (Signed)
I,April Miller,acting as a scribe for Wilhemena Durie, MD.,have documented all relevant documentation on the behalf of Wilhemena Durie, MD,as directed by  Wilhemena Durie, MD while in the presence of Wilhemena Durie, MD.  Established patient visit   Patient: Antonio Whitney   DOB: February 15, 1955   65 y.o. Male  MRN: [id number] Visit Date: 04/10/2020  Today's healthcare provider: Wilhemena Durie, MD   Chief Complaint  Patient presents with  . Follow-up  . Diabetes  . Hypertension   Subjective    HPI  Patient comes in today for follow-up.  He feels well.  He is overworked but is still exercising by spinning 32 minutes 5 days a week.  He is not checking his blood sugars and we will arrange for ergometer for home use. He is changing pharmacies and we are working overall refills today. Diabetes Mellitus Type II, follow-up  Lab Results  Component Value Date   HGBA1C 11.6 (A) 04/10/2020   HGBA1C 7.5 (A) 10/17/2019   HGBA1C 7.0 (A) 06/13/2019   Last seen for diabetes 4 months ago.  Management since then includes continuing the same treatment. He reports none compliance with treatment. He is not having side effects. none  Home blood sugar records: fasting range: not checking  Episodes of hypoglycemia? No none   Current insulin regiment: n/a Most Recent Eye Exam: 07/24/2019  --------------------------------------------------------------------  Hypertension, follow-up  BP Readings from Last 3 Encounters:  04/10/20 117/77  12/12/19 126/77  10/17/19 125/80   Wt Readings from Last 3 Encounters:  04/10/20 206 lb (93.4 kg)  12/12/19 206 lb (93.4 kg)  10/17/19 207 lb 3.2 oz (94 kg)     He was last seen for hypertension 4 months ago.  BP at that visit was 126/77. Management since that visit includes; Good control. He reports good compliance with treatment. He is not having side effects. none He is exercising. He is adherent to low salt diet.   Outside  blood pressures are not checking.  He does not smoke.  Use of agents associated with hypertension: none.   --------------------------------------------------------------------  Attention deficit disorder, unspecified hyperactivity presence From 12/12/2019-refilled Adderall. Patient is only taking Adderall on as he works.  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       Medications: Outpatient Medications Prior to Visit  Medication Sig  . Cholecalciferol (VITAMIN D) 50 MCG (2000 UT) CAPS Take by mouth.  . Inulin (METAMUCIL CLEAR & NATURAL) POWD Take by mouth.  . Multiple Vitamins-Minerals (CENTRUM SILVER 50+MEN PO) daily.  . NON FORMULARY CPAP at night  . Omega-3 Fatty Acids (FISH OIL) 1200 MG CAPS Take by mouth.  . [DISCONTINUED] allopurinol (ZYLOPRIM) 300 MG tablet TAKE 1 TABLET(300 MG) BY MOUTH DAILY  . [DISCONTINUED] amLODipine-benazepril (LOTREL) 5-20 MG capsule TAKE 1 CAPSULE BY MOUTH DAILY  . [DISCONTINUED] amphetamine-dextroamphetamine (ADDERALL) 10 MG tablet Take 1 tablet (10 mg total) by mouth daily.  . [DISCONTINUED] amphetamine-dextroamphetamine (ADDERALL) 10 MG tablet Take 1 tablet (10 mg total) by mouth daily.  . [DISCONTINUED] amphetamine-dextroamphetamine (ADDERALL) 10 MG tablet TAKE 1 TABLET(10 MG) BY MOUTH DAILY WITH BREAKFAST  . [DISCONTINUED] finasteride (PROSCAR) 5 MG tablet Take 0.5 tablets (2.5 mg total) by mouth every other day.  . [DISCONTINUED] metFORMIN (GLUCOPHAGE) 500 MG tablet TAKE 1 TABLET(500 MG) BY MOUTH DAILY WITH BREAKFAST  . [DISCONTINUED] simvastatin (ZOCOR) 20 MG tablet TAKE 1 TABLET(20 MG) BY MOUTH AT BEDTIME  . hydrocortisone (ANUSOL-HC) 2.5 % rectal cream  Place 1 application rectally 2 (two) times daily. (Patient not taking: No sig reported)   No facility-administered medications prior to visit.    Review of Systems      Objective    BP 117/77 (BP Location: Left Arm, Patient Position: Sitting, Cuff Size: Large)   Pulse (!) 103    Resp 16   Ht 5' 7"  (1.702 m)   Wt 206 lb (93.4 kg)   SpO2 100%   BMI 32.26 kg/m  BP Readings from Last 3 Encounters:  04/10/20 117/77  12/12/19 126/77  10/17/19 125/80   Wt Readings from Last 3 Encounters:  04/10/20 206 lb (93.4 kg)  12/12/19 206 lb (93.4 kg)  10/17/19 207 lb 3.2 oz (94 kg)       Physical Exam Vitals reviewed.  Constitutional:      Appearance: Normal appearance.  HENT:     Head: Normocephalic and atraumatic.     Right Ear: External ear normal.     Left Ear: External ear normal.  Eyes:     General: No scleral icterus.    Conjunctiva/sclera: Conjunctivae normal.  Cardiovascular:     Rate and Rhythm: Normal rate and regular rhythm.     Pulses: Normal pulses.     Heart sounds: Normal heart sounds.  Pulmonary:     Effort: Pulmonary effort is normal.     Breath sounds: Normal breath sounds.  Musculoskeletal:     Right lower leg: No edema.     Left lower leg: No edema.  Skin:    General: Skin is warm and dry.  Neurological:     General: No focal deficit present.     Mental Status: He is alert and oriented to person, place, and time.  Psychiatric:        Mood and Affect: Mood normal.        Behavior: Behavior normal.        Thought Content: Thought content normal.        Judgment: Judgment normal.       Results for orders placed or performed in visit on 04/10/20  POCT glycosylated hemoglobin (Hb A1C)  Result Value Ref Range   Hemoglobin A1C 11.6 (A) 4.0 - 5.6 %   Est. average glucose Bld gHb Est-mCnc 286     Assessment & Plan     1. Diabetes mellitus without complication (HCC) C5E is very poorly controlled today at 11.6. We discussed the natural course of type 2 diabetes with progression.  Stressed the importance of diet as he is already exercising.  Weight loss, patient acknowledges he wants to lose weight.  We discussed the need to check blood sugars every morning to know how he is trending and he will see how his habits affect his blood   sugars. Added Farxiga 10 mg qd.  - POCT glycosylated hemoglobin (Hb A1C) - metFORMIN (GLUCOPHAGE) 500 MG tablet; Take 1 tablet (500 mg total) by mouth daily with breakfast.  Dispense: 90 tablet; Refill: 3 - dapagliflozin propanediol (FARXIGA) 10 MG TABS tablet; Take 1 tablet (10 mg total) by mouth daily before breakfast.  Dispense: 30 tablet; Refill: 3 - blood glucose meter kit and supplies; Dispense based on patient and insurance preference. Check blood sugars every morning before breakfast. (FOR ICD-10 E11.9).  Dispense: 1 each; Refill: 0  2. Essential (primary) hypertension  - amLODipine-benazepril (LOTREL) 5-20 MG capsule; Take 1 capsule by mouth daily.  Dispense: 90 capsule; Refill: 3  3. Gout, unspecified cause, unspecified chronicity, unspecified site  -  allopurinol (ZYLOPRIM) 300 MG tablet; TAKE 1 TABLET(300 MG) BY MOUTH DAILY  Dispense: 30 tablet; Refill: 12  4. Benign prostatic hyperplasia without lower urinary tract symptoms  - finasteride (PROSCAR) 5 MG tablet; Take 0.5 tablets (2.5 mg total) by mouth every other day.  Dispense: 30 tablet; Refill: 11  5. Hypercholesteremia  - simvastatin (ZOCOR) 20 MG tablet; TAKE 1 TABLET(20 MG) BY MOUTH AT BEDTIME  Dispense: 90 tablet; Refill: 3  6. Attention deficit disorder, unspecified hyperactivity presence  - amphetamine-dextroamphetamine (ADDERALL) 10 MG tablet; Take 1 tablet (10 mg total) by mouth daily.  Dispense: 30 tablet; Refill: 0 - amphetamine-dextroamphetamine (ADDERALL) 10 MG tablet; Take 1 tablet (10 mg total) by mouth daily.  Dispense: 30 tablet; Refill: 0 - amphetamine-dextroamphetamine (ADDERALL) 10 MG tablet; TAKE 1 TABLET(10 MG) BY MOUTH DAILY WITH BREAKFAST  Dispense: 30 tablet; Refill: 0   Return in about 3 months (around 07/11/2020).      I, Wilhemena Durie, MD, have reviewed all documentation for this visit. The documentation on 04/10/20 for the exam, diagnosis, procedures, and orders are all accurate and  complete.    Aerik Polan Cranford Mon, MD  [facility] [phone] (phone) [phone] (fax)  [place]

## 2020-07-16 NOTE — Progress Notes (Signed)
I,April Miller,acting as a scribe for Wilhemena Durie, MD.,have documented all relevant documentation on the behalf of Wilhemena Durie, MD,as directed by  Wilhemena Durie, MD while in the presence of Wilhemena Durie, MD.   Established patient visit   Patient: Antonio Whitney   DOB: [birth year]   65 y.o. Male  MRN: [id number] Visit Date: 07/17/2020  Today's healthcare provider: Wilhemena Durie, MD   Chief Complaint  Patient presents with   Follow-up   Diabetes   Hypertension   Subjective    HPI  Patient has no complaints.  He has been working hard on diet and exercise.  He is expecting his first grandchild in January of next year. Diabetes Mellitus Type II, Follow-up  Lab Results  Component Value Date   HGBA1C 6.9 (A) 07/22/2020   HGBA1C 11.6 (A) 04/10/2020   HGBA1C 7.5 (A) 10/17/2019   Wt Readings from Last 3 Encounters:  07/17/20 190 lb (86.2 kg)  04/10/20 206 lb (93.4 kg)  12/12/19 206 lb (93.4 kg)   Last seen for diabetes 3 months ago.  Management since then includes adding Farxiga 42m daily. He reports good compliance with treatment. He is not having side effects. none Home blood sugar records: fasting range: 132-136  Episodes of hypoglycemia? No none   Current insulin regiment: n/a Most Recent Eye Exam: 07/24/2019 Current exercise: walking and spin Current diet habits: in general, a "healthy" diet    Pertinent Labs: Lab Results  Component Value Date   CHOL 174 12/12/2019   HDL 77 12/12/2019   LDLCALC 78 12/12/2019   TRIG 110 12/12/2019   CHOLHDL 2.3 12/12/2019   Lab Results  Component Value Date   NA 141 12/12/2019   K 4.4 12/12/2019   CREATININE 1.24 12/12/2019   GFRNONAA 61 12/12/2019   GFRAA 71 12/12/2019   GLUCOSE 205 (H) 12/12/2019     Hypertension, follow-up  BP Readings from Last 3 Encounters:  07/17/20 118/73  04/10/20 117/77  12/12/19 126/77   Wt Readings from Last 3 Encounters:  07/17/20 190 lb (86.2 kg)   04/10/20 206 lb (93.4 kg)  12/12/19 206 lb (93.4 kg)     He was last seen for hypertension 3 months ago.  BP at that visit was 117/77. Management since that visit includes no medication changes.  He reports good compliance with treatment. He is not having side effects. none He is following a Regular diet. He is exercising. He does not smoke.  Use of agents associated with hypertension: none.   Outside blood pressures are not checking. Pertinent labs: Lab Results  Component Value Date   CHOL 174 12/12/2019   HDL 77 12/12/2019   LDLCALC 78 12/12/2019   TRIG 110 12/12/2019   CHOLHDL 2.3 12/12/2019   Lab Results  Component Value Date   NA 141 12/12/2019   K 4.4 12/12/2019   CREATININE 1.24 12/12/2019   GFRNONAA 61 12/12/2019   GFRAA 71 12/12/2019   GLUCOSE 205 (H) 12/12/2019     The 10-year ASCVD risk score (Mikey BussingDC Jr., et al., 2013) is: 15.5%   Follow up for ADD  The patient was last seen for this 3 months ago. Changes made at last visit include no medication changes. Refilled Adderall.   He reports good compliance with treatment. He feels that condition is Unchanged. He is not having side effects. none  --------------------------------------------------------------------       Medications: Outpatient Medications Prior to Visit  Medication Sig  allopurinol (ZYLOPRIM) 300 MG tablet TAKE 1 TABLET(300 MG) BY MOUTH DAILY   amLODipine-benazepril (LOTREL) 5-20 MG capsule Take 1 capsule by mouth daily.   blood glucose meter kit and supplies Dispense based on patient and insurance preference. Check blood sugars every morning before breakfast. (FOR ICD-10 E11.9).   Cholecalciferol (VITAMIN D) 50 MCG (2000 UT) CAPS Take by mouth.   finasteride (PROSCAR) 5 MG tablet Take 0.5 tablets (2.5 mg total) by mouth every other day.   Inulin (METAMUCIL CLEAR & NATURAL) POWD Take by mouth.   metFORMIN (GLUCOPHAGE) 500 MG tablet Take 1 tablet (500 mg total) by mouth daily with  breakfast.   Multiple Vitamins-Minerals (CENTRUM SILVER 50+MEN PO) daily.   NON FORMULARY CPAP at night   Omega-3 Fatty Acids (FISH OIL) 1200 MG CAPS Take by mouth.   simvastatin (ZOCOR) 20 MG tablet TAKE 1 TABLET(20 MG) BY MOUTH AT BEDTIME   [DISCONTINUED] amphetamine-dextroamphetamine (ADDERALL) 10 MG tablet Take 1 tablet (10 mg total) by mouth daily.   [DISCONTINUED] amphetamine-dextroamphetamine (ADDERALL) 10 MG tablet Take 1 tablet (10 mg total) by mouth daily.   [DISCONTINUED] amphetamine-dextroamphetamine (ADDERALL) 10 MG tablet TAKE 1 TABLET(10 MG) BY MOUTH DAILY WITH BREAKFAST   [DISCONTINUED] dapagliflozin propanediol (FARXIGA) 10 MG TABS tablet Take 1 tablet (10 mg total) by mouth daily before breakfast.   hydrocortisone (ANUSOL-HC) 2.5 % rectal cream Place 1 application rectally 2 (two) times daily. (Patient not taking: No sig reported)   No facility-administered medications prior to visit.    Review of Systems  Constitutional:  Negative for activity change and fatigue.  Respiratory:  Negative for cough and shortness of breath.   Cardiovascular:  Negative for chest pain, palpitations and leg swelling.  Endocrine: Negative for cold intolerance, heat intolerance, polydipsia, polyphagia and polyuria.  Neurological:  Negative for dizziness, light-headedness and headaches.  Psychiatric/Behavioral:  Negative for agitation, self-injury, sleep disturbance and suicidal ideas. The patient is not nervous/anxious.        Objective    BP 118/73 (BP Location: Left Arm, Patient Position: Sitting, Cuff Size: Normal)   Pulse 87   Resp 16   Ht 5' 7"  (1.702 m)   Wt 190 lb (86.2 kg)   SpO2 97%   BMI 29.76 kg/m  BP Readings from Last 3 Encounters:  07/17/20 118/73  04/10/20 117/77  12/12/19 126/77   Wt Readings from Last 3 Encounters:  07/17/20 190 lb (86.2 kg)  04/10/20 206 lb (93.4 kg)  12/12/19 206 lb (93.4 kg)       Physical Exam Vitals reviewed.  Constitutional:       Appearance: Normal appearance.  HENT:     Head: Normocephalic and atraumatic.     Right Ear: External ear normal.     Left Ear: External ear normal.  Eyes:     General: No scleral icterus.    Conjunctiva/sclera: Conjunctivae normal.  Cardiovascular:     Rate and Rhythm: Normal rate and regular rhythm.     Pulses: Normal pulses.     Heart sounds: Normal heart sounds.  Pulmonary:     Effort: Pulmonary effort is normal.     Breath sounds: Normal breath sounds.  Musculoskeletal:     Right lower leg: No edema.     Left lower leg: No edema.  Skin:    General: Skin is warm and dry.  Neurological:     General: No focal deficit present.     Mental Status: He is alert and oriented to person, place, and  time.  Psychiatric:        Mood and Affect: Mood normal.        Behavior: Behavior normal.        Thought Content: Thought content normal.        Judgment: Judgment normal.      Results for orders placed or performed in visit on 07/17/20  POCT glycosylated hemoglobin (Hb A1C)  Result Value Ref Range   Hemoglobin A1C 6.9 (A) 4.0 - 5.6 %   Est. average glucose Bld gHb Est-mCnc 151     Assessment & Plan     1. Diabetes mellitus without complication ([place]) 2.  Wilder Glade has been increased to 5 mg every morning.  Follow-up 3 to 4 months.  Well-controlled on Farxiga and metformin. - POCT glycosylated hemoglobin (Hb A1C)-- 6.9 today. - dapagliflozin propanediol (FARXIGA) 10 MG TABS tablet; Take 1 tablet (10 mg total) by mouth daily before breakfast.  Dispense: 30 tablet; Refill: 3  2. Attention deficit disorder, unspecified hyperactivity presence Refill x3. - amphetamine-dextroamphetamine (ADDERALL) 10 MG tablet; Take 1 tablet (10 mg total) by mouth daily.  Dispense: 30 tablet; Refill: 0 - amphetamine-dextroamphetamine (ADDERALL) 10 MG tablet; Take 1 tablet (10 mg total) by mouth daily.  Dispense: 30 tablet; Refill: 0 - amphetamine-dextroamphetamine (ADDERALL) 10 MG tablet; TAKE 1  TABLET(10 MG) BY MOUTH DAILY WITH BREAKFAST  Dispense: 30 tablet; Refill: 0 - Accu-Chek Softclix Lancets lancets; Check blood sugar once daily  Dispense: 100 each; Refill: 12 - glucose blood (ACCU-CHEK GUIDE) test strip; Check blood sugar once daily  Dispense: 100 each; Refill: 12  3. Essential (primary) hypertension Controlled on Lotrel  4. Hypercholesteremia On simvastatin 20.  Consider changing to rosuvastatin 20   Return in about 5 months (around 12/09/2020).      I, April Miller, CMA, have reviewed all documentation for this visit. The documentation on 07/22/20 for the exam, diagnosis, procedures, and orders are all accurate and complete.    Hridhaan Yohn Cranford Mon, MD  [facility] [phone] (phone) [phone] (fax)  [place]

## 2020-07-17 ENCOUNTER — Other Ambulatory Visit: Payer: Self-pay

## 2020-07-17 ENCOUNTER — Ambulatory Visit: Payer: BC Managed Care – PPO | Admitting: Family Medicine

## 2020-07-17 ENCOUNTER — Encounter: Payer: Self-pay | Admitting: Family Medicine

## 2020-07-17 VITALS — BP 118/73 | HR 87 | Resp 16 | Ht 67.0 in | Wt 190.0 lb

## 2020-07-17 DIAGNOSIS — I10 Essential (primary) hypertension: Secondary | ICD-10-CM | POA: Diagnosis not present

## 2020-07-17 DIAGNOSIS — E7800 Pure hypercholesterolemia, unspecified: Secondary | ICD-10-CM | POA: Diagnosis not present

## 2020-07-17 DIAGNOSIS — F988 Other specified behavioral and emotional disorders with onset usually occurring in childhood and adolescence: Secondary | ICD-10-CM

## 2020-07-17 DIAGNOSIS — E119 Type 2 diabetes mellitus without complications: Secondary | ICD-10-CM | POA: Diagnosis not present

## 2020-07-17 MED ORDER — ACCU-CHEK SOFTCLIX LANCETS MISC: 12 refills | Status: DC

## 2020-07-17 MED ORDER — AMPHETAMINE-DEXTROAMPHETAMINE 10 MG PO TABS
ORAL_TABLET | ORAL | 0 refills | Status: DC
Start: 2020-07-17 — End: 2020-11-20

## 2020-07-17 MED ORDER — ACCU-CHEK GUIDE VI STRP: ORAL_STRIP | 12 refills | Status: DC

## 2020-07-17 MED ORDER — DAPAGLIFLOZIN PROPANEDIOL 10 MG PO TABS: 10.0000 mg | ORAL_TABLET | Freq: Every day | ORAL | 3 refills | Status: DC

## 2020-07-17 MED ORDER — AMPHETAMINE-DEXTROAMPHETAMINE 10 MG PO TABS: 10.0000 mg | ORAL_TABLET | Freq: Every day | ORAL | 0 refills | Status: DC

## 2020-07-22 LAB — POCT GLYCOSYLATED HEMOGLOBIN (HGB A1C)
Est. average glucose Bld gHb Est-mCnc: 151
Hemoglobin A1C: 6.9 % — AB (ref 4.0–5.6)

## 2020-11-19 ENCOUNTER — Other Ambulatory Visit: Payer: Self-pay | Admitting: Family Medicine

## 2020-11-19 DIAGNOSIS — F988 Other specified behavioral and emotional disorders with onset usually occurring in childhood and adolescence: Secondary | ICD-10-CM

## 2020-11-20 NOTE — Telephone Encounter (Signed)
Requested medications are due for refill today.  yes  Requested medications are on the active medications list.  yes  Last refill. 07/17/2020  Future visit scheduled.   yes  Notes to clinic.  Medication not delegated. 

## 2020-12-05 ENCOUNTER — Other Ambulatory Visit: Payer: Self-pay | Admitting: Family Medicine

## 2020-12-05 DIAGNOSIS — E119 Type 2 diabetes mellitus without complications: Secondary | ICD-10-CM

## 2020-12-05 NOTE — Telephone Encounter (Signed)
Requested medications are due for refill today.  unknown  Requested medications are on the active medications list.  yes  Last refill. 07/17/2020  Future visit scheduled.   yes  Notes to clinic.  Per not from OV 07/17/2020 Dr. Wonda Olds states that Antonio Whitney was increased to 5 mg. Dose on Rx and med list is 10mg . Please advise.

## 2020-12-09 ENCOUNTER — Ambulatory Visit (INDEPENDENT_AMBULATORY_CARE_PROVIDER_SITE_OTHER): Payer: BC Managed Care – PPO | Admitting: Family Medicine

## 2020-12-09 ENCOUNTER — Other Ambulatory Visit: Payer: Self-pay

## 2020-12-09 VITALS — BP 117/75 | HR 92 | Temp 98.8°F | Wt 191.0 lb

## 2020-12-09 DIAGNOSIS — E7800 Pure hypercholesterolemia, unspecified: Secondary | ICD-10-CM | POA: Diagnosis not present

## 2020-12-09 DIAGNOSIS — F988 Other specified behavioral and emotional disorders with onset usually occurring in childhood and adolescence: Secondary | ICD-10-CM

## 2020-12-09 DIAGNOSIS — I10 Essential (primary) hypertension: Secondary | ICD-10-CM

## 2020-12-09 DIAGNOSIS — E119 Type 2 diabetes mellitus without complications: Secondary | ICD-10-CM | POA: Diagnosis not present

## 2020-12-09 DIAGNOSIS — Z125 Encounter for screening for malignant neoplasm of prostate: Secondary | ICD-10-CM

## 2020-12-09 DIAGNOSIS — Z23 Encounter for immunization: Secondary | ICD-10-CM

## 2020-12-09 DIAGNOSIS — Z0000 Encounter for general adult medical examination without abnormal findings: Secondary | ICD-10-CM | POA: Diagnosis not present

## 2020-12-09 DIAGNOSIS — M109 Gout, unspecified: Secondary | ICD-10-CM

## 2020-12-09 DIAGNOSIS — E559 Vitamin D deficiency, unspecified: Secondary | ICD-10-CM

## 2020-12-09 DIAGNOSIS — G4730 Sleep apnea, unspecified: Secondary | ICD-10-CM

## 2020-12-09 DIAGNOSIS — N400 Enlarged prostate without lower urinary tract symptoms: Secondary | ICD-10-CM

## 2020-12-09 NOTE — Progress Notes (Signed)
Complete physical exam   Patient: Antonio Whitney   DOB: 1955-03-05   65 y.o. Male  MRN: [id number] Visit Date: 12/09/2020 He is expecting his  No chief complaint on file.  Subjective    Antonio Whitney is a 65 y.o. male who presents today for a complete physical exam.  He reports consuming a general diet.  He generally feels well. He reports sleeping well. He does not have additional problems to discuss today.  HPI  Call he feels well and has no complaints.  Exercises regularly. He is expecting his first grandchild soon. Past Medical History:  Diagnosis Date   Attention deficit disorder    Diabetes ([place])    Hyperlipidemia    Past Surgical History:  Procedure Laterality Date   APPENDECTOMY     HERNIA REPAIR     inguinal left-1980   Social History   Socioeconomic History   Marital status: Married    Spouse name: Antonio Whitney   Number of children: 2   Years of education: 16   Highest education Whitney: Not on file  Occupational History   Occupation: Dentist now  Tobacco Use   Smoking status: Never   Smokeless tobacco: Never  Vaping Use   Vaping Use: Never used  Substance and Sexual Activity   Alcohol use: No   Drug use: No   Sexual activity: Yes  Other Topics Concern   Not on file  Social History Narrative   Not on file   Social Determinants of Health   Financial Resource Strain: Not on file  Food Insecurity: Not on file  Transportation Needs: Not on file  Physical Activity: Not on file  Stress: Not on file  Social Connections: Not on file  Intimate Partner Violence: Not on file   Family Status  Relation Name Status   Mother  Deceased at age 45   Father  Deceased at age 56       laryngeal cancer   Brother  Alive   Family History  Problem Relation Age of Onset   Breast cancer Mother    Lung cancer Father    No Known Allergies  Patient Care Team: Jerrol Banana., MD as PCP - General (Family Medicine)    Medications: Outpatient Medications Prior to Visit  Medication Sig   Accu-Chek Softclix Lancets lancets Check blood sugar once daily   allopurinol (ZYLOPRIM) 300 MG tablet TAKE 1 TABLET(300 MG) BY MOUTH DAILY   amLODipine-benazepril (LOTREL) 5-20 MG capsule Take 1 capsule by mouth daily.   amphetamine-dextroamphetamine (ADDERALL) 10 MG tablet Take 1 tablet (10 mg total) by mouth daily.   amphetamine-dextroamphetamine (ADDERALL) 10 MG tablet Take 1 tablet (10 mg total) by mouth daily.   amphetamine-dextroamphetamine (ADDERALL) 10 MG tablet TAKE ONE TABLET BY MOUTH EVERY MORNING WITH BREAKFAST   blood glucose meter kit and supplies Dispense based on patient and insurance preference. Check blood sugars every morning before breakfast. (FOR ICD-10 E11.9).   Cholecalciferol (VITAMIN D) 50 MCG (2000 UT) CAPS Take by mouth.   FARXIGA 10 MG TABS tablet TAKE ONE TABLET BY MOUTH EVERY MORNING BEFORE BREAKFAST   finasteride (PROSCAR) 5 MG tablet Take 0.5 tablets (2.5 mg total) by mouth every other day.   glucose blood (ACCU-CHEK GUIDE) test strip Check blood sugar once daily   hydrocortisone (ANUSOL-HC) 2.5 % rectal cream Place 1 application rectally 2 (two) times daily. (Patient not taking: No sig reported)   Inulin (METAMUCIL CLEAR & NATURAL) POWD Take  by mouth.   metFORMIN (GLUCOPHAGE) 500 MG tablet Take 1 tablet (500 mg total) by mouth daily with breakfast.   Multiple Vitamins-Minerals (CENTRUM SILVER 50+MEN PO) daily.   NON FORMULARY CPAP at night   Omega-3 Fatty Acids (FISH OIL) 1200 MG CAPS Take by mouth.   simvastatin (ZOCOR) 20 MG tablet TAKE 1 TABLET(20 MG) BY MOUTH AT BEDTIME   No facility-administered medications prior to visit.    Review of Systems  Constitutional: Negative.   HENT:  Positive for dental problem.   Eyes: Negative.   Respiratory:  Positive for apnea.   Cardiovascular: Negative.   Gastrointestinal: Negative.   Endocrine: Negative.   Genitourinary: Negative.    Musculoskeletal: Negative.   Skin: Negative.   Allergic/Immunologic: Negative.   Neurological: Negative.   Hematological: Negative.   Psychiatric/Behavioral: Negative.        Objective    BP 117/75 (BP Location: Right Arm, Patient Position: Sitting, Cuff Size: Large)   Pulse 92   Temp 98.8 F (37.1 C) (Oral)   Wt 191 lb (86.6 kg)   SpO2 98%   BMI 29.91 kg/m  BP Readings from Last 3 Encounters:  12/09/20 117/75  07/17/20 118/73  04/10/20 117/77   Wt Readings from Last 3 Encounters:  12/09/20 191 lb (86.6 kg)  07/17/20 190 lb (86.2 kg)  04/10/20 206 lb (93.4 kg)      Physical Exam Constitutional:      Appearance: Normal appearance. He is normal weight.  HENT:     Head: Normocephalic and atraumatic.     Right Ear: Tympanic membrane, ear canal and external ear normal.     Left Ear: Tympanic membrane, ear canal and external ear normal.     Nose: Nose normal.     Mouth/Throat:     Mouth: Mucous membranes are moist.     Pharynx: Oropharynx is clear.  Eyes:     Extraocular Movements: Extraocular movements intact.     Conjunctiva/sclera: Conjunctivae normal.     Pupils: Pupils are equal, round, and reactive to light.  Cardiovascular:     Rate and Rhythm: Normal rate and regular rhythm.     Pulses: Normal pulses.     Heart sounds: Normal heart sounds.  Pulmonary:     Effort: Pulmonary effort is normal.     Breath sounds: Normal breath sounds.  Abdominal:     General: Abdomen is flat. Bowel sounds are normal.     Palpations: Abdomen is soft.  Musculoskeletal:     Cervical back: Normal range of motion and neck supple.     Right lower leg: No edema.     Left lower leg: No edema.  Skin:    General: Skin is warm and dry.  Neurological:     General: No focal deficit present.     Mental Status: He is alert and oriented to person, place, and time. Mental status is at baseline.  Psychiatric:        Mood and Affect: Mood normal.        Behavior: Behavior normal.         Thought Content: Thought content normal.        Judgment: Judgment normal.      Last depression screening scores PHQ 2/9 Scores 12/09/2020 07/17/2020 04/10/2020  PHQ - 2 Score 0 0 0  PHQ- 9 Score 0 0 0   Last fall risk screening Fall Risk  12/09/2020  Falls in the past year? 0  Number falls in past yr:  0  Injury with Fall? 0  Follow up -   Last Audit-C alcohol use screening Alcohol Use Disorder Test (AUDIT) 12/09/2020  1. How often do you have a drink containing alcohol? 0  2. How many drinks containing alcohol do you have on a typical day when you are drinking? 0  3. How often do you have six or more drinks on one occasion? 0  AUDIT-C Score 0  Alcohol Brief Interventions/Follow-up -   A score of 3 or more in women, and 4 or more in men indicates increased risk for alcohol abuse, EXCEPT if all of the points are from question 1   No results found for any visits on 12/09/20.  Assessment & Plan    Routine Health Maintenance and Physical Exam  Exercise Activities and Dietary recommendations  Goals   None     Immunization History  Administered Date(s) Administered   Fluad Quad(high Dose 65+) 12/09/2020   Influenza,inj,Quad PF,6+ Mos 10/11/2014, 12/14/2015, 10/22/2016, 12/01/2017, 12/07/2018, 12/12/2019   PFIZER(Purple Top)SARS-COV-2 Vaccination 05/01/2019, 05/26/2019, 12/24/2019   Pneumococcal Polysaccharide-23 10/22/2016   Tdap 07/01/2011   Zoster, Live 10/16/2015    Health Maintenance  Topic Date Due   HIV Screening  Never done   Zoster Vaccines- Shingrix (1 of 2) Never done   Pneumonia Vaccine 84+ Years old (2 - PCV) 10/22/2017   COVID-19 Vaccine (4 - Booster for Pfizer series) 02/18/2020   OPHTHALMOLOGY EXAM  07/23/2020   FOOT EXAM  12/11/2020   HEMOGLOBIN A1C  01/21/2021   COLONOSCOPY (Pts 45-31yr Insurance coverage will need to be confirmed)  03/02/2021   TETANUS/TDAP  06/30/2021   INFLUENZA VACCINE  Completed   Hepatitis C Screening  Completed   HPV  VACCINES  Aged Out    Discussed health benefits of physical activity, and encouraged him to engage in regular exercise appropriate for his age and condition.  1. Annual physical exam  - CBC with Differential/Platelet - Comprehensive metabolic panel - Lipid Panel With LDL/HDL Ratio - TSH  2. Essential (primary) hypertension   3. Hypercholesteremia   4. Diabetes mellitus without complication ([facility]p Whitney  - Hemoglobin A1c  5. Prostate cancer screening  - PSA  6. Need for influenza vaccination  - Flu Vaccine QUAD High Dose(Fluad)  7. Attention deficit disorder, unspecified hyperactivity presence 3 months of prescriptions given - amphetamine-dextroamphetamine (ADDERALL) 10 MG tablet; Take 1 tablet (10 mg total) by mouth daily.  Dispense: 30 tablet; Refill: 0 - amphetamine-dextroamphetamine (ADDERALL) 10 MG tablet; Take 1 tablet (10 mg total) by mouth daily.  Dispense: 30 tablet; Refill: 0 - amphetamine-dextroamphetamine (ADDERALL) 10 MG tablet; TAKE ONE TABLET BY MOUTH EVERY MORNING WITH BREAKFAST  Dispense: 30 tablet; Refill: 0  8. Need for pneumococcal vaccination   9. Need for pertussis vaccination  - Tdap vaccine greater than or equal to 7yo IM  10. Sleep apnea, unspecified type CPAP nightly - For home use only DME continuous positive airway pressure (CPAP)  11. Gout, unspecified cause, unspecified chronicity, unspecified site   12. Benign prostatic hyperplasia without lower urinary tract symptoms   13. Avitaminosis D    No follow-ups on file.     I, RWilhemena Durie MD, have reviewed all documentation for this visit. The documentation on 12/13/20 for the exam, diagnosis, procedures, and orders are all accurate and complete.    Stephanieann Popescu GCranford Mon MD  [phone](phone) [phone](fax)  [place]

## 2020-12-10 LAB — CBC WITH DIFFERENTIAL/PLATELET
Basophils Absolute: 0 10*3/uL (ref 0.0–0.2)
Basos: 0 %
EOS (ABSOLUTE): 0 10*3/uL (ref 0.0–0.4)
Eos: 1 %
Hematocrit: 45.8 % (ref 37.5–51.0)
Hemoglobin: 15.1 g/dL (ref 13.0–17.7)
Immature Grans (Abs): 0 10*3/uL (ref 0.0–0.1)
Immature Granulocytes: 0 %
Lymphocytes Absolute: 1.5 10*3/uL (ref 0.7–3.1)
Lymphs: 24 %
MCH: 29.8 pg (ref 26.6–33.0)
MCHC: 33 g/dL (ref 31.5–35.7)
MCV: 90 fL (ref 79–97)
Monocytes Absolute: 0.5 10*3/uL (ref 0.1–0.9)
Monocytes: 8 %
Neutrophils Absolute: 4.2 10*3/uL (ref 1.4–7.0)
Neutrophils: 67 %
Platelets: 274 10*3/uL (ref 150–450)
RBC: 5.07 x10E6/uL (ref 4.14–5.80)
RDW: 12.4 % (ref 11.6–15.4)
WBC: 6.3 10*3/uL (ref 3.4–10.8)

## 2020-12-10 LAB — COMPREHENSIVE METABOLIC PANEL
ALT: 23 IU/L (ref 0–44)
AST: 20 IU/L (ref 0–40)
Albumin/Globulin Ratio: 2.1 (ref 1.2–2.2)
Albumin: 4.9 g/dL — ABNORMAL HIGH (ref 3.8–4.8)
Alkaline Phosphatase: 76 IU/L (ref 44–121)
BUN/Creatinine Ratio: 22 (ref 10–24)
BUN: 23 mg/dL (ref 8–27)
Bilirubin Total: 0.5 mg/dL (ref 0.0–1.2)
CO2: 23 mmol/L (ref 20–29)
Calcium: 10.1 mg/dL (ref 8.6–10.2)
Chloride: 100 mmol/L (ref 96–106)
Creatinine, Ser: 1.04 mg/dL (ref 0.76–1.27)
Globulin, Total: 2.3 g/dL (ref 1.5–4.5)
Glucose: 124 mg/dL — ABNORMAL HIGH (ref 70–99)
Potassium: 4 mmol/L (ref 3.5–5.2)
Sodium: 138 mmol/L (ref 134–144)
Total Protein: 7.2 g/dL (ref 6.0–8.5)
eGFR: 80 mL/min/{1.73_m2} (ref >59)

## 2020-12-10 LAB — HEMOGLOBIN A1C
Est. average glucose Bld gHb Est-mCnc: 148 mg/dL
Hgb A1c MFr Bld: 6.8 % — ABNORMAL HIGH (ref 4.8–5.6)

## 2020-12-10 LAB — LIPID PANEL WITH LDL/HDL RATIO
Cholesterol, Total: 182 mg/dL (ref 100–199)
HDL: 84 mg/dL (ref >39)
LDL Chol Calc (NIH): 83 mg/dL (ref 0–99)
LDL/HDL Ratio: 1 ratio (ref 0.0–3.6)
Triglycerides: 81 mg/dL (ref 0–149)
VLDL Cholesterol Cal: 15 mg/dL (ref 5–40)

## 2020-12-10 LAB — TSH: TSH: 1.35 u[IU]/mL (ref 0.450–4.500)

## 2020-12-10 LAB — PSA: Prostate Specific Ag, Serum: 2.7 ng/mL (ref 0.0–4.0)

## 2020-12-13 MED ORDER — AMPHETAMINE-DEXTROAMPHETAMINE 10 MG PO TABS: ORAL_TABLET | ORAL | 0 refills | Status: DC

## 2020-12-13 MED ORDER — AMPHETAMINE-DEXTROAMPHETAMINE 10 MG PO TABS: 10.0000 mg | ORAL_TABLET | Freq: Every day | ORAL | 0 refills | Status: DC

## 2021-03-22 ENCOUNTER — Other Ambulatory Visit: Payer: Self-pay | Admitting: Family Medicine

## 2021-03-22 DIAGNOSIS — F988 Other specified behavioral and emotional disorders with onset usually occurring in childhood and adolescence: Secondary | ICD-10-CM

## 2021-03-24 NOTE — Telephone Encounter (Signed)
Requested medication (s) are due for refill today: Yes  Requested medication (s) are on the active medication list: Yes  Last refill:  12/13/20  Future visit scheduled: Yes  Notes to clinic:  See request.    Requested Prescriptions  Pending Prescriptions Disp Refills   amphetamine-dextroamphetamine (ADDERALL) 10 MG tablet [Pharmacy Med Name: AMPHETAMINE SALTS 10MG  TAB(GEN ADDERALL)] 30 tablet 0    Sig: TAKE ONE TABLET BY MOUTH ONE TIME DAILY IN THE MORNING WITH BREAKFAST     Not Delegated - Psychiatry:  Stimulants/ADHD Failed - 03/22/2021  3:22 PM      Failed - This refill cannot be delegated      Failed - Urine Drug Screen completed in last 360 days      Passed - Last BP in normal range    BP Readings from Last 1 Encounters:  12/09/20 117/75          Passed - Last Heart Rate in normal range    Pulse Readings from Last 1 Encounters:  12/09/20 92          Passed - Valid encounter within last 6 months    Recent Outpatient Visits           3 months ago Annual physical exam   [facility]   8 months ago Diabetes mellitus without complication [facility])   [facility]   11 months ago Diabetes mellitus without complication [facility])   [facility]   1 year ago Annual physical exam   [facility]   1 year ago Diabetes mellitus without complication [facility])   [facility] MD       Future Appointments             In 2 weeks Maple Hudson., MD [facility]

## 2021-04-01 ENCOUNTER — Other Ambulatory Visit: Payer: Self-pay | Admitting: Family Medicine

## 2021-04-01 DIAGNOSIS — E119 Type 2 diabetes mellitus without complications: Secondary | ICD-10-CM

## 2021-04-01 DIAGNOSIS — I10 Essential (primary) hypertension: Secondary | ICD-10-CM

## 2021-04-04 ENCOUNTER — Other Ambulatory Visit: Payer: Self-pay | Admitting: Family Medicine

## 2021-04-04 DIAGNOSIS — E119 Type 2 diabetes mellitus without complications: Secondary | ICD-10-CM

## 2021-04-04 NOTE — Telephone Encounter (Signed)
Requested Prescriptions  Pending Prescriptions Disp Refills   FARXIGA 10 MG TABS tablet [Pharmacy Med Name: FARXIGA 10 MG TAB[**$]] 30 tablet 3    Sig: TAKE ONE TABLET BY MOUTH EVERY MORNING BEFORE BREAKFAST     Endocrinology:  Diabetes - SGLT2 Inhibitors Passed - 04/04/2021  2:04 AM      Passed - Cr in normal range and within 360 days    Creat  Date Value Ref Range Status  10/22/2016 1.08 0.70 - 1.25 mg/dL Final    Comment:    For patients >66 years of age, the reference limit for Creatinine is approximately 13% higher for people identified as African-American. .    Creatinine, Ser  Date Value Ref Range Status  12/09/2020 1.04 0.76 - 1.27 mg/dL Final         Passed - HBA1C is between 0 and 7.9 and within 180 days    Hgb A1c MFr Bld  Date Value Ref Range Status  12/09/2020 6.8 (H) 4.8 - 5.6 % Final    Comment:             Prediabetes: 5.7 - 6.4          Diabetes: >6.4          Glycemic control for adults with diabetes: <7.0          Passed - eGFR in normal range and within 360 days    GFR, Est African American  Date Value Ref Range Status  10/22/2016 85 > OR = 60 mL/min/1.35m Final   GFR calc Af Amer  Date Value Ref Range Status  12/12/2019 71 >59 mL/min/1.73 Final    Comment:    **In accordance with recommendations from the NKF-ASN Task force,**   [facility] is in the process of updating its eGFR calculation to the   2021 CKD-EPI creatinine equation that estimates kidney function   without a race variable.    GFR, Est Non African American  Date Value Ref Range Status  10/22/2016 74 > OR = 60 mL/min/1.740mFinal   GFR calc non Af Amer  Date Value Ref Range Status  12/12/2019 61 >59 mL/min/1.73 Final   eGFR  Date Value Ref Range Status  12/09/2020 80 >59 mL/min/1.73 Final         Passed - Valid encounter within last 6 months    Recent Outpatient Visits          3 months ago [facility] physical [facility]   [facility]   [facility]   [facility]      Future Appointments            In 5 days [facility]Jerrol Banana [facility]

## 2021-04-05 ENCOUNTER — Other Ambulatory Visit: Payer: Self-pay | Admitting: Family Medicine

## 2021-04-05 DIAGNOSIS — I10 Essential (primary) hypertension: Secondary | ICD-10-CM

## 2021-04-05 DIAGNOSIS — E119 Type 2 diabetes mellitus without complications: Secondary | ICD-10-CM

## 2021-04-07 NOTE — Telephone Encounter (Signed)
Duplicate request

## 2021-04-08 NOTE — Progress Notes (Signed)
? ?I,Antonio Whitney,acting as a scribe for Antonio Durie, MD.,have documented all relevant documentation on the behalf of Antonio Durie, MD,as directed by  Antonio Durie, MD while in the presence of Antonio Durie, MD. ?  ? ? ?Established patient visit ? ? ?Patient: Antonio Whitney   DOB: 03-16-1955   66 y.o. Male  MRN: [id number] ?Visit Date: 04/09/2021 ? ?Today's healthcare provider: Wilhemena Durie, MD  ? ?No chief complaint on file. ? ?Subjective  ?  ?HPI  ?Comes in today for follow-up.  About a new CPAP for home use.  He is now a new grandfather as his daughter in [place] just had a baby boy named Sam. ?Feels well and has no complaints.  He requests refills for Adderall.  Does have a URI presently no known COVID exposure.  He has a nonproductive cough but otherwise does not feel sick. ?Diabetes Mellitus Type II, Follow-up ? ?Lab Results  ?Component Value Date  ? HGBA1C 6.8 (H) 12/09/2020  ? HGBA1C 6.9 (A) 07/22/2020  ? HGBA1C 11.6 (A) 04/10/2020  ? ?Wt Readings from Last 3 Encounters:  ?12/09/20 191 lb (86.6 kg)  ?07/17/20 190 lb (86.2 kg)  ?04/10/20 206 lb (93.4 kg)  ? ?Last seen for diabetes 4 months ago.  ?Management since then includes none. ?He reports good compliance with treatment. ?He is not having side effects.  ?Symptoms: ?No fatigue No foot ulcerations  ?No appetite changes No nausea  ?No paresthesia of the feet  No polydipsia  ?No polyuria No visual disturbances   ?No vomiting   ? ? ?Home blood sugar records: trend: decreasing steadily ? ?Episodes of hypoglycemia? No  ?  ?Current insulin regiment:  ?Most Recent Eye Exam:  ?Current exercise: yard work ?Current diet habits: in general, a "healthy" diet   ? ?Pertinent Labs: ?Lab Results  ?Component Value Date  ? CHOL 182 12/09/2020  ? HDL 84 12/09/2020  ? [place] 83 12/09/2020  ? TRIG 81 12/09/2020  ? CHOLHDL 2.3 12/12/2019  ? Lab Results  ?Component Value Date  ? NA 138 12/09/2020  ? K 4.0 12/09/2020  ? CREATININE 1.04 12/09/2020   ? EGFR 80 12/09/2020  ? MICROALBUR 20 12/12/2019  ?  ? ?--------------------------------------------------------------------------------------------------- ? ?Patient also needs to follow up on ADD.  He was last seen for this 4 months ago.  He reports he is compliant and tolerant of his medications.  He is not having any side effects.  ? ? ?Medications: ?Outpatient Medications Prior to Visit  ?Medication Sig  ? Accu-Chek Softclix Lancets lancets Check blood sugar once daily  ? allopurinol (ZYLOPRIM) 300 MG tablet TAKE 1 TABLET(300 MG) BY MOUTH DAILY  ? amLODipine-benazepril (LOTREL) 5-20 MG capsule TAKE ONE CAPSULE BY MOUTH ONE TIME DAILY  ? amphetamine-dextroamphetamine (ADDERALL) 10 MG tablet TAKE ONE TABLET BY MOUTH ONE TIME DAILY IN THE MORNING WITH BREAKFAST  ? blood glucose meter kit and supplies Dispense based on patient and insurance preference. Check blood sugars every morning before breakfast. (FOR ICD-10 E11.9).  ? Cholecalciferol (VITAMIN D) 50 MCG (2000 UT) CAPS Take by mouth.  ? FARXIGA 10 MG TABS tablet TAKE ONE TABLET BY MOUTH EVERY MORNING BEFORE BREAKFAST  ? finasteride (PROSCAR) 5 MG tablet Take 0.5 tablets (2.5 mg total) by mouth every other day.  ? glucose blood (ACCU-CHEK GUIDE) test strip Check blood sugar once daily  ? hydrocortisone (ANUSOL-HC) 2.5 % rectal cream Place 1 application rectally 2 (two) times daily.  ? Inulin (  METAMUCIL CLEAR & NATURAL) POWD Take by mouth.  ? metFORMIN (GLUCOPHAGE) 500 MG tablet TAKE ONE TABLET BY MOUTH ONE TIME DAILY WITH BREAKFAST  ? Multiple Vitamins-Minerals (CENTRUM SILVER 50+MEN PO) daily.  ? NON FORMULARY CPAP at night  ? Omega-3 Fatty Acids (FISH OIL) 1200 MG CAPS Take by mouth.  ? simvastatin (ZOCOR) 20 MG tablet TAKE 1 TABLET(20 MG) BY MOUTH AT BEDTIME  ? [DISCONTINUED] amphetamine-dextroamphetamine (ADDERALL) 10 MG tablet Take 1 tablet (10 mg total) by mouth daily.  ? [DISCONTINUED] amphetamine-dextroamphetamine (ADDERALL) 10 MG tablet Take 1 tablet  (10 mg total) by mouth daily.  ? ?No facility-administered medications prior to visit.  ? ? ?Review of Systems ? ?  ?  Objective  ?  ?There were no vitals taken for this visit. ?BP Readings from Last 3 Encounters:  ?04/09/21 106/78  ?12/09/20 117/75  ?07/17/20 118/73  ? ?Wt Readings from Last 3 Encounters:  ?04/09/21 196 lb 8 oz (89.1 kg)  ?12/09/20 191 lb (86.6 kg)  ?07/17/20 190 lb (86.2 kg)  ? ?  ? ?Physical Exam ?Vitals reviewed.  ?Constitutional:   ?   Appearance: Normal appearance.  ?HENT:  ?   Head: Normocephalic and atraumatic.  ?   Right Ear: External ear normal.  ?   Left Ear: External ear normal.  ?Eyes:  ?   General: No scleral icterus. ?   Conjunctiva/sclera: Conjunctivae normal.  ?Cardiovascular:  ?   Rate and Rhythm: Normal rate and regular rhythm.  ?   Pulses: Normal pulses.  ?   Heart sounds: Normal heart sounds.  ?Pulmonary:  ?   Effort: Pulmonary effort is normal.  ?   Breath sounds: Wheezing and rhonchi present.  ?   Comments: Mild But diffuse rhonchi and some expiratory wheezes. ?Musculoskeletal:  ?   Right lower leg: No edema.  ?   Left lower leg: No edema.  ?Skin: ?   General: Skin is warm and dry.  ?Neurological:  ?   General: No focal deficit present.  ?   Mental Status: He is alert and oriented to person, place, and time.  ?Psychiatric:     ?   Mood and Affect: Mood normal.     ?   Behavior: Behavior normal.     ?   Thought Content: Thought content normal.     ?   Judgment: Judgment normal.  ?  ? ? ?No results found for any visits on 04/09/21. ? Assessment & Plan  ?  ? ?1. Diabetes mellitus without complication ([place]) ?Decent control at 7.5.  Work on diet and exercise. ?- dapagliflozin propanediol (FARXIGA) 10 MG TABS tablet; Take 1 tablet (10 mg total) by mouth daily with breakfast.  Dispense: 90 tablet; Refill: 3 ?- POCT glycosylated hemoglobin (Hb A1C) ? ?2. Hypercholesteremia ? ?- simvastatin (ZOCOR) 20 MG tablet; TAKE 1 TABLET(20 MG) BY MOUTH AT BEDTIME  Dispense: 90 tablet; Refill:  3 ? ?3. Gout, unspecified cause, unspecified chronicity, unspecified site ? ?- allopurinol (ZYLOPRIM) 300 MG tablet; TAKE 1 TABLET(300 MG) BY MOUTH DAILY  Dispense: 90 tablet; Refill: 3 ? ?4. Attention deficit disorder, unspecified hyperactivity presence ? ?- amphetamine-dextroamphetamine (ADDERALL) 10 MG tablet; TAKE ONE TABLET BY MOUTH ONE TIME DAILY IN THE MORNING WITH BREAKFAST  Dispense: 30 tablet; Refill: 0 ?- amphetamine-dextroamphetamine (ADDERALL) 10 MG tablet; Take 1 tablet (10 mg total) by mouth daily.  Dispense: 30 tablet; Refill: 0 ?- amphetamine-dextroamphetamine (ADDERALL) 10 MG tablet; Take 1 tablet (10 mg total) by mouth daily.  Dispense: 30 tablet; Refill: 0 ? ?5. Viral URI ?Lower URI and possibly even COVID.  Patient oximetry is 98% and he is in no acute distress.  Have advised him to get a home COVID test and start Robitussin twice a day and vitamin C vitamin D and zinc daily. ? ? ?No follow-ups on file.  ?   ? ?I, Antonio Durie, MD, have reviewed all documentation for this visit. The documentation on 04/11/21 for the exam, diagnosis, procedures, and orders are all accurate and complete. ? ? ? ?Antonio Mclean Cranford Mon, MD  ?[facility] ?[phone] (phone) ?[phone] (fax) ? ?[facility] ?

## 2021-04-09 ENCOUNTER — Encounter: Payer: Self-pay | Admitting: Family Medicine

## 2021-04-09 ENCOUNTER — Other Ambulatory Visit: Payer: Self-pay

## 2021-04-09 ENCOUNTER — Ambulatory Visit: Payer: BC Managed Care – PPO | Admitting: Family Medicine

## 2021-04-09 VITALS — BP 106/78 | HR 90 | Temp 98.4°F | Ht 67.0 in | Wt 196.5 lb

## 2021-04-09 DIAGNOSIS — M109 Gout, unspecified: Secondary | ICD-10-CM

## 2021-04-09 DIAGNOSIS — J069 Acute upper respiratory infection, unspecified: Secondary | ICD-10-CM

## 2021-04-09 DIAGNOSIS — E119 Type 2 diabetes mellitus without complications: Secondary | ICD-10-CM

## 2021-04-09 DIAGNOSIS — E7800 Pure hypercholesterolemia, unspecified: Secondary | ICD-10-CM | POA: Diagnosis not present

## 2021-04-09 DIAGNOSIS — F988 Other specified behavioral and emotional disorders with onset usually occurring in childhood and adolescence: Secondary | ICD-10-CM

## 2021-04-09 LAB — POCT GLYCOSYLATED HEMOGLOBIN (HGB A1C): Hemoglobin A1C: 7.5 % — AB (ref 4.0–5.6)

## 2021-04-09 MED ORDER — ALLOPURINOL 300 MG PO TABS: ORAL_TABLET | ORAL | 3 refills | Status: DC

## 2021-04-09 MED ORDER — AMPHETAMINE-DEXTROAMPHETAMINE 10 MG PO TABS: 10.0000 mg | ORAL_TABLET | Freq: Every day | ORAL | 0 refills | Status: DC

## 2021-04-09 MED ORDER — DAPAGLIFLOZIN PROPANEDIOL 10 MG PO TABS: 10.0000 mg | ORAL_TABLET | Freq: Every day | ORAL | 3 refills | Status: DC

## 2021-04-09 MED ORDER — SIMVASTATIN 20 MG PO TABS: ORAL_TABLET | ORAL | 3 refills | Status: DC

## 2021-04-09 MED ORDER — AMPHETAMINE-DEXTROAMPHETAMINE 10 MG PO TABS: ORAL_TABLET | ORAL | 0 refills | Status: DC

## 2021-04-09 NOTE — Patient Instructions (Signed)
Robitussin daily ?Over the Counter Vit D, Vit C and Zinc daily ?

## 2021-06-03 ENCOUNTER — Other Ambulatory Visit: Payer: Self-pay | Admitting: Family Medicine

## 2021-06-03 DIAGNOSIS — N400 Enlarged prostate without lower urinary tract symptoms: Secondary | ICD-10-CM

## 2021-07-25 ENCOUNTER — Other Ambulatory Visit: Payer: Self-pay | Admitting: Family Medicine

## 2021-07-25 DIAGNOSIS — F988 Other specified behavioral and emotional disorders with onset usually occurring in childhood and adolescence: Secondary | ICD-10-CM

## 2021-07-25 LAB — HM DIABETES EYE EXAM

## 2021-07-30 ENCOUNTER — Encounter: Payer: Self-pay | Admitting: Family Medicine

## 2021-08-02 ENCOUNTER — Other Ambulatory Visit: Payer: Self-pay

## 2021-08-02 ENCOUNTER — Emergency Department: Payer: BC Managed Care – PPO

## 2021-08-02 ENCOUNTER — Emergency Department
Admission: EM | Admit: 2021-08-02 | Discharge: 2021-08-02 | Disposition: A | Discharge status: Home / Self Care | Payer: BC Managed Care – PPO | Attending: Student in an Organized Health Care Education/Training Program | Admitting: Student in an Organized Health Care Education/Training Program

## 2021-08-02 ENCOUNTER — Encounter: Payer: Self-pay | Admitting: Emergency Medicine

## 2021-08-02 DIAGNOSIS — S82862A Displaced Maisonneuve's fracture of left leg, initial encounter for closed fracture: Secondary | ICD-10-CM | POA: Insufficient documentation

## 2021-08-02 DIAGNOSIS — W11XXXA Fall on and from ladder, initial encounter: Secondary | ICD-10-CM | POA: Insufficient documentation

## 2021-08-02 DIAGNOSIS — S99912A Unspecified injury of left ankle, initial encounter: Secondary | ICD-10-CM | POA: Diagnosis present

## 2021-08-02 MED ORDER — ACETAMINOPHEN 325 MG PO TABS
ORAL_TABLET | ORAL | Status: AC
  Filled 2021-08-02: qty 1

## 2021-08-02 MED ORDER — IBUPROFEN 600 MG PO TABS
600.0000 mg | ORAL_TABLET | Freq: Once | ORAL | Status: AC
  Administered 2021-08-02: 600 mg via ORAL
  Filled 2021-08-02: qty 1

## 2021-08-02 MED ORDER — ACETAMINOPHEN 325 MG PO TABS
650.0000 mg | ORAL_TABLET | Freq: Once | ORAL | Status: AC
  Administered 2021-08-02: 650 mg via ORAL
  Filled 2021-08-02: qty 2

## 2021-08-02 NOTE — ED Provider Notes (Signed)
[facility] Provider Note    Event Date/Time   First MD Initiated Contact with Patient 08/02/21 1240     (approximate)   History   Fall   HPI  Antonio Whitney is a 66 y.o. male who presents today for evaluation of ankle pain.  Patient had a fall from a 6 foot ladder.  Patient reports that the ladder slipped from under him because the ground was soft and he fell directly onto his left ankle and then he felt his ankle twist.  He reports that he has pain to the medial aspect of his ankle only.  He denies numbness or tingling.  He reports after he landed he rolled onto his side but denies any other injury.  There is no head strike or LOC.  Denies any other injury.     Physical Exam   Triage Vital Signs: ED Triage Vitals [08/02/21 1236]  Enc Vitals Group     BP      Pulse      Resp      Temp      Temp src      SpO2      Weight 196 lb 6.9 oz (89.1 kg)     Height 5\' 7"  (1.702 m)     Head Circumference      Peak Flow      Pain Score 7     Pain Loc      Pain Edu?      Excl. in GC?     Most recent vital signs: Vitals:   08/02/21 1243 08/02/21 1539  BP: (!) 142/83 114/70  Pulse: (!) 101 83  Resp: 16 16  Temp: 97.8 F (36.6 C)   SpO2: 96% 94%    Physical Exam Vitals and nursing note reviewed.  Constitutional:      General: Awake and alert. No acute distress.    Appearance: Normal appearance. The patient is normal weight.  HENT:     Head: Normocephalic and atraumatic.     Mouth: Mucous membranes are moist.  Eyes:     General: PERRL. Normal EOMs        Right eye: No discharge.        Left eye: No discharge.     Conjunctiva/sclera: Conjunctivae normal.  Cardiovascular:     Rate and Rhythm: Normal rate and regular rhythm.     Pulses: Normal pulses.     Heart sounds: Normal heart sounds Pulmonary:     Effort: Pulmonary effort is normal. No respiratory distress.     Breath sounds: Normal breath sounds.  Abdominal:     Abdomen is soft.  There is no abdominal tenderness.  Musculoskeletal:        General: No swelling. Normal range of motion.     Cervical back: Normal range of motion and neck supple.  Left ankle: No open wounds. Normal pedal pulses. Sensation intact to light touch throughout foot/ankle/lower extremity. Mild proximal fibula tenderness without swelling or ecchymosis. TTP to medial malleolus. No tenderness to lateral malleolus. Negative Thompson test. Skin:    General: Skin is warm and dry.     Capillary Refill: Capillary refill takes less than 2 seconds.     Findings: No rash.  Neurological:     Mental Status: The patient is awake and alert.      ED Results / Procedures / Treatments   Labs (all labs ordered are listed, but only abnormal results are displayed) Labs Reviewed -  No data to display   EKG     RADIOLOGY I independently reviewed and interpreted imaging and agree with radiologists findings.     PROCEDURES:  Critical Care performed:   .Ortho Injury Treatment  Date/Time: 08/02/2021 6:40 PM  Performed by: Jackelyn Hoehn, PA-C Authorized by: Jackelyn Hoehn, PA-C   Consent:    Consent obtained:  Verbal   Consent given by:  Patient   Risks discussed:  Fracture, irreducible dislocation, nerve damage, recurrent dislocation, stiffness, restricted joint movement and vascular damage   Alternatives discussed:  Immobilization and delayed treatmentInjury location: ankle Location details: left ankle Injury type: fracture (maissoneuve) Pre-procedure neurovascular assessment: neurovascularly intact Pre-procedure distal perfusion: normal Pre-procedure neurological function: normal Pre-procedure range of motion: reduced  Anesthesia: Local anesthesia used: no Manipulation performed: no Immobilization: splint Splint type: short leg and sugar tong (Sugartong and posterior) Splint Applied by: ED Provider Supplies used: cotton padding and Ortho-Glass Post-procedure neurovascular assessment:  post-procedure neurovascularly intact Post-procedure distal perfusion: normal Post-procedure neurological function: normal Post-procedure range of motion: unchanged Post-procedure range of motion comment: immobilized Comments: Repeat reduction and splinting performed with Dr. Roxan Hockey given no improved alignment on first attempt      MEDICATIONS ORDERED IN ED: Medications  ibuprofen (ADVIL) tablet 600 mg (600 mg Oral Given 08/02/21 1326)  acetaminophen (TYLENOL) tablet 650 mg (325 mg Oral Not Given 08/02/21 1345)     IMPRESSION / MDM / ASSESSMENT AND PLAN / ED COURSE  I reviewed the triage vital signs and the nursing notes.   Differential diagnosis includes, but is not limited to, ankle fracture, contusion, ligamental injury, dislocation.  Patient is awake and alert, neurovascularly intact.  He has tenderness to palpation along the medial aspect of his ankle.  There is no tenderness along his tibia or fibula, no swelling in this area to suggest a Maisonneuve fracture or an occult proximal fibula fracture.  No tenderness or swelling in the foot itself.  There is swelling only over the medial malleolus.  No calcaneal tenderness or swelling to suggest calcaneal fracture.  No tenderness along the Achilles tendon, normal plantarflexion and dorsiflexion against resistance, negative Thompson test, do not suspect Achilles tendon rupture.   Discussed with orthopedics who recommends reduction, splinting, nonweightbearing, and outpatient management.  First attempt at reduction was unsuccessful. Splint removed and second attempt with Dr. Roxan Hockey demonstrates improved alignment of the mortise.  Posterior and sugar-tong splint applied.  He remained NV intact before and after. Patient was instructed to remain nonweightbearing, also instructed to keep his leg elevated as much as possible to help with swelling and pain.  We discussed return precautions and the importance of close outpatient follow-up with  orthopedics.  The appropriate information was provided.  Patient understands and agrees with plan.  He was discharged in the care of his wife.   Patient's presentation is most consistent with acute presentation with potential threat to life or bodily function.    Clinical Course as of 08/02/21 1842  Sat Aug 02, 2021  1328 Orthopedics paged. Plan for reduction, splint, and outpatient management [JP]  1504 No significant improvement. Splint removed, second attempt at reduction and splinting [JP]    Clinical Course User Index [JP] Florie Carico, Herb Grays, PA-C     FINAL CLINICAL IMPRESSION(S) / ED DIAGNOSES   Final diagnoses:  Closed displaced Maisonneuve fracture of left lower extremity, initial encounter     Rx / DC Orders   ED Discharge Orders     None  Note:  This document was prepared using Dragon voice recognition software and may include unintentional dictation errors.   Keturah Shavers 08/02/21 1844    Willy Eddy, MD 08/02/21 Corky Crafts

## 2021-08-02 NOTE — ED Triage Notes (Signed)
Fall from 6 foot ladder.  C/O left ankle pain

## 2021-08-04 ENCOUNTER — Other Ambulatory Visit: Payer: Self-pay | Admitting: Surgery

## 2021-08-04 ENCOUNTER — Other Ambulatory Visit: Payer: Self-pay | Admitting: Family Medicine

## 2021-08-04 DIAGNOSIS — F988 Other specified behavioral and emotional disorders with onset usually occurring in childhood and adolescence: Secondary | ICD-10-CM

## 2021-08-04 NOTE — Pre-Procedure Instructions (Signed)
Patient instructions including medications discussed with patient over the phone.Patient verbalized understanding.

## 2021-08-05 ENCOUNTER — Ambulatory Visit: Payer: BC Managed Care – PPO | Admitting: Certified Registered Nurse Anesthetist

## 2021-08-05 ENCOUNTER — Other Ambulatory Visit: Payer: Self-pay | Admitting: Family Medicine

## 2021-08-05 ENCOUNTER — Other Ambulatory Visit: Payer: Self-pay

## 2021-08-05 ENCOUNTER — Ambulatory Visit
Admission: RE | Admit: 2021-08-05 | Discharge: 2021-08-05 | Disposition: A | Discharge status: Home / Self Care | Payer: BC Managed Care – PPO | Source: Ambulatory Visit | Attending: Surgery | Admitting: Surgery

## 2021-08-05 ENCOUNTER — Ambulatory Visit: Payer: BC Managed Care – PPO

## 2021-08-05 ENCOUNTER — Encounter: Payer: Self-pay | Admitting: Surgery

## 2021-08-05 ENCOUNTER — Encounter
Admission: RE | Disposition: A | Discharge status: Home / Self Care | Payer: Self-pay | Source: Ambulatory Visit | Attending: Surgery

## 2021-08-05 DIAGNOSIS — W11XXXA Fall on and from ladder, initial encounter: Secondary | ICD-10-CM | POA: Insufficient documentation

## 2021-08-05 DIAGNOSIS — K219 Gastro-esophageal reflux disease without esophagitis: Secondary | ICD-10-CM | POA: Insufficient documentation

## 2021-08-05 DIAGNOSIS — M109 Gout, unspecified: Secondary | ICD-10-CM | POA: Diagnosis not present

## 2021-08-05 DIAGNOSIS — E119 Type 2 diabetes mellitus without complications: Secondary | ICD-10-CM | POA: Diagnosis not present

## 2021-08-05 DIAGNOSIS — I10 Essential (primary) hypertension: Secondary | ICD-10-CM | POA: Diagnosis not present

## 2021-08-05 DIAGNOSIS — S82862A Displaced Maisonneuve's fracture of left leg, initial encounter for closed fracture: Secondary | ICD-10-CM | POA: Insufficient documentation

## 2021-08-05 DIAGNOSIS — G4739 Other sleep apnea: Secondary | ICD-10-CM

## 2021-08-05 DIAGNOSIS — F988 Other specified behavioral and emotional disorders with onset usually occurring in childhood and adolescence: Secondary | ICD-10-CM

## 2021-08-05 HISTORY — DX: Essential (primary) hypertension: I10

## 2021-08-05 HISTORY — PX: SYNDESMOSIS REPAIR: SHX5182

## 2021-08-05 HISTORY — DX: Gout, unspecified: M10.9

## 2021-08-05 LAB — CBC
HCT: 42 % (ref 39.0–52.0)
Hemoglobin: 14 g/dL (ref 13.0–17.0)
MCH: 30.2 pg (ref 26.0–34.0)
MCHC: 33.3 g/dL (ref 30.0–36.0)
MCV: 90.7 fL (ref 80.0–100.0)
Platelets: 262 10*3/uL (ref 150–400)
RBC: 4.63 MIL/uL (ref 4.22–5.81)
RDW: 13 % (ref 11.5–15.5)
WBC: 7.5 10*3/uL (ref 4.0–10.5)
nRBC: 0 % (ref 0.0–0.2)

## 2021-08-05 LAB — GLUCOSE, CAPILLARY: Glucose-Capillary: 157 mg/dL — ABNORMAL HIGH (ref 70–99)

## 2021-08-05 LAB — BASIC METABOLIC PANEL
Anion gap: 14 (ref 5–15)
BUN: 20 mg/dL (ref 8–23)
CO2: 20 mmol/L — ABNORMAL LOW (ref 22–32)
Calcium: 9.5 mg/dL (ref 8.9–10.3)
Chloride: 106 mmol/L (ref 98–111)
Creatinine, Ser: 1.01 mg/dL (ref 0.61–1.24)
GFR, Estimated: >60 mL/min (ref >60)
Glucose, Bld: 169 mg/dL — ABNORMAL HIGH (ref 70–99)
Potassium: 4.1 mmol/L (ref 3.5–5.1)
Sodium: 140 mmol/L (ref 135–145)

## 2021-08-05 SURGERY — REPAIR, SYNDESMOSIS, ANKLE
Anesthesia: General | Site: Ankle | Laterality: Left

## 2021-08-05 MED ORDER — ACETAMINOPHEN 10 MG/ML IV SOLN
INTRAVENOUS | Status: DC | PRN
  Administered 2021-08-05: 1000 mg via INTRAVENOUS

## 2021-08-05 MED ORDER — METOCLOPRAMIDE HCL 10 MG PO TABS: 5.0000 mg | ORAL_TABLET | Freq: Three times a day (TID) | ORAL | Status: DC | PRN

## 2021-08-05 MED ORDER — FENTANYL CITRATE (PF) 100 MCG/2ML IJ SOLN
INTRAMUSCULAR | Status: DC | PRN
  Administered 2021-08-05: 50 ug via INTRAVENOUS

## 2021-08-05 MED ORDER — METOCLOPRAMIDE HCL 5 MG/ML IJ SOLN: 5.0000 mg | Freq: Three times a day (TID) | INTRAMUSCULAR | Status: DC | PRN

## 2021-08-05 MED ORDER — CHLORHEXIDINE GLUCONATE 0.12 % MT SOLN: 15.0000 mL | Freq: Once | OROMUCOSAL | Status: AC

## 2021-08-05 MED ORDER — CEFAZOLIN SODIUM-DEXTROSE 2-4 GM/100ML-% IV SOLN
2.0000 g | INTRAVENOUS | Status: AC
  Administered 2021-08-05: 2 g via INTRAVENOUS

## 2021-08-05 MED ORDER — SODIUM CHLORIDE 0.9 % IV SOLN
INTRAVENOUS | Status: DC
Start: 2021-08-05 — End: 2021-08-05

## 2021-08-05 MED ORDER — BUPIVACAINE HCL (PF) 0.5 % IJ SOLN
INTRAMUSCULAR | Status: AC
  Filled 2021-08-05: qty 30

## 2021-08-05 MED ORDER — CHLORHEXIDINE GLUCONATE 0.12 % MT SOLN
OROMUCOSAL | Status: AC
  Administered 2021-08-05: 15 mL via OROMUCOSAL
  Filled 2021-08-05: qty 15

## 2021-08-05 MED ORDER — OXYCODONE HCL 5 MG/5ML PO SOLN: 5.0000 mg | Freq: Once | ORAL | Status: DC | PRN

## 2021-08-05 MED ORDER — MIDAZOLAM HCL 2 MG/2ML IJ SOLN
INTRAMUSCULAR | Status: AC
  Filled 2021-08-05: qty 2

## 2021-08-05 MED ORDER — HYDROCODONE-ACETAMINOPHEN 5-325 MG PO TABS: 1.0000 | ORAL_TABLET | ORAL | Status: DC | PRN

## 2021-08-05 MED ORDER — CEFAZOLIN SODIUM-DEXTROSE 2-4 GM/100ML-% IV SOLN
INTRAVENOUS | Status: AC
  Filled 2021-08-05: qty 100

## 2021-08-05 MED ORDER — LIDOCAINE HCL (CARDIAC) PF 100 MG/5ML IV SOSY
PREFILLED_SYRINGE | INTRAVENOUS | Status: DC | PRN
  Administered 2021-08-05: 80 mg via INTRAVENOUS

## 2021-08-05 MED ORDER — FAMOTIDINE 20 MG PO TABS: 20.0000 mg | ORAL_TABLET | Freq: Once | ORAL | Status: AC

## 2021-08-05 MED ORDER — PHENYLEPHRINE 80 MCG/ML (10ML) SYRINGE FOR IV PUSH (FOR BLOOD PRESSURE SUPPORT)
PREFILLED_SYRINGE | INTRAVENOUS | Status: DC | PRN
  Administered 2021-08-05: 80 ug via INTRAVENOUS

## 2021-08-05 MED ORDER — HYDROCODONE-ACETAMINOPHEN 5-325 MG PO TABS
1.0000 | ORAL_TABLET | Freq: Four times a day (QID) | ORAL | 0 refills | Status: DC | PRN
Start: 2021-08-05 — End: 2022-01-23

## 2021-08-05 MED ORDER — KETOROLAC TROMETHAMINE 15 MG/ML IJ SOLN
INTRAMUSCULAR | Status: AC
  Administered 2021-08-05: 15 mg via INTRAVENOUS
  Filled 2021-08-05: qty 1

## 2021-08-05 MED ORDER — LIDOCAINE HCL (PF) 1 % IJ SOLN
INTRAMUSCULAR | Status: AC
  Filled 2021-08-05: qty 5

## 2021-08-05 MED ORDER — ORAL CARE MOUTH RINSE: 15.0000 mL | Freq: Once | OROMUCOSAL | Status: AC

## 2021-08-05 MED ORDER — BUPIVACAINE HCL 0.5 % IJ SOLN
INTRAMUSCULAR | Status: DC | PRN
  Administered 2021-08-05: 10 mL

## 2021-08-05 MED ORDER — 0.9 % SODIUM CHLORIDE (POUR BTL) OPTIME
TOPICAL | Status: DC | PRN
  Administered 2021-08-05: 500 mL

## 2021-08-05 MED ORDER — ROPIVACAINE HCL 5 MG/ML IJ SOLN
INTRAMUSCULAR | Status: AC
  Filled 2021-08-05: qty 30

## 2021-08-05 MED ORDER — FAMOTIDINE 20 MG PO TABS
ORAL_TABLET | ORAL | Status: AC
  Administered 2021-08-05: 20 mg via ORAL
  Filled 2021-08-05: qty 1

## 2021-08-05 MED ORDER — MIDAZOLAM HCL 2 MG/2ML IJ SOLN
INTRAMUSCULAR | Status: DC | PRN
  Administered 2021-08-05: 2 mg via INTRAVENOUS

## 2021-08-05 MED ORDER — PROPOFOL 10 MG/ML IV BOLUS
INTRAVENOUS | Status: DC | PRN
  Administered 2021-08-05: 160 mg via INTRAVENOUS

## 2021-08-05 MED ORDER — ONDANSETRON HCL 4 MG PO TABS: 4.0000 mg | ORAL_TABLET | Freq: Four times a day (QID) | ORAL | Status: DC | PRN

## 2021-08-05 MED ORDER — SODIUM CHLORIDE 0.9 % IV SOLN: INTRAVENOUS | Status: DC

## 2021-08-05 MED ORDER — ROPIVACAINE HCL 5 MG/ML IJ SOLN
INTRAMUSCULAR | Status: DC | PRN
  Administered 2021-08-05 (×2): 10 mL via PERINEURAL
  Administered 2021-08-05: 8 mL via PERINEURAL
  Administered 2021-08-05: 2 mL via PERINEURAL

## 2021-08-05 MED ORDER — MIDAZOLAM HCL 2 MG/2ML IJ SOLN
INTRAMUSCULAR | Status: AC
  Administered 2021-08-05: 1 mg via INTRAVENOUS
  Filled 2021-08-05: qty 2

## 2021-08-05 MED ORDER — KETOROLAC TROMETHAMINE 15 MG/ML IJ SOLN: 15.0000 mg | Freq: Once | INTRAMUSCULAR | Status: AC

## 2021-08-05 MED ORDER — FENTANYL CITRATE (PF) 100 MCG/2ML IJ SOLN
INTRAMUSCULAR | Status: AC
  Filled 2021-08-05: qty 2

## 2021-08-05 MED ORDER — FENTANYL CITRATE (PF) 100 MCG/2ML IJ SOLN: 25.0000 ug | INTRAMUSCULAR | Status: DC | PRN

## 2021-08-05 MED ORDER — DEXAMETHASONE SODIUM PHOSPHATE 10 MG/ML IJ SOLN
INTRAMUSCULAR | Status: DC | PRN
  Administered 2021-08-05: 4 mg via INTRAVENOUS

## 2021-08-05 MED ORDER — OXYCODONE HCL 5 MG PO TABS: 5.0000 mg | ORAL_TABLET | Freq: Once | ORAL | Status: DC | PRN

## 2021-08-05 MED ORDER — FENTANYL CITRATE PF 50 MCG/ML IJ SOSY: 50.0000 ug | PREFILLED_SYRINGE | Freq: Once | INTRAMUSCULAR | Status: AC

## 2021-08-05 MED ORDER — AMLODIPINE BESY-BENAZEPRIL HCL 5-20 MG PO CAPS: 1.0000 | ORAL_CAPSULE | Freq: Every day | ORAL | Status: DC

## 2021-08-05 MED ORDER — FENTANYL CITRATE PF 50 MCG/ML IJ SOSY
PREFILLED_SYRINGE | INTRAMUSCULAR | Status: AC
  Administered 2021-08-05: 50 ug via INTRAVENOUS
  Filled 2021-08-05: qty 1

## 2021-08-05 MED ORDER — ACETAMINOPHEN 10 MG/ML IV SOLN
INTRAVENOUS | Status: AC
  Filled 2021-08-05: qty 100

## 2021-08-05 MED ORDER — ONDANSETRON HCL 4 MG/2ML IJ SOLN
INTRAMUSCULAR | Status: DC | PRN
  Administered 2021-08-05: 4 mg via INTRAVENOUS

## 2021-08-05 MED ORDER — ONDANSETRON HCL 4 MG/2ML IJ SOLN: 4.0000 mg | Freq: Four times a day (QID) | INTRAMUSCULAR | Status: DC | PRN

## 2021-08-05 MED ORDER — MIDAZOLAM HCL 2 MG/2ML IJ SOLN: 1.0000 mg | INTRAMUSCULAR | Status: DC | PRN

## 2021-08-05 SURGICAL SUPPLY — 40 items
APL PRP STRL LF DISP 70% ISPRP (MISCELLANEOUS) ×1
BLADE SURG SZ10 CARB STEEL (BLADE) ×6 IMPLANT
BNDG COHESIVE 4X5 TAN ST LF (GAUZE/BANDAGES/DRESSINGS) ×3 IMPLANT
BNDG ELASTIC 4X5.8 VLCR STR LF (GAUZE/BANDAGES/DRESSINGS) ×6 IMPLANT
BNDG ELASTIC 6X5.8 VLCR STR LF (GAUZE/BANDAGES/DRESSINGS) ×3 IMPLANT
BNDG ESMARK 6X12 TAN STRL LF (GAUZE/BANDAGES/DRESSINGS) ×3 IMPLANT
CHLORAPREP W/TINT 26 (MISCELLANEOUS) ×5 IMPLANT
DRAPE C-ARM XRAY 36X54 (DRAPES) ×3 IMPLANT
DRAPE C-ARMOR (DRAPES) ×3 IMPLANT
DRAPE INCISE IOBAN 66X45 STRL (DRAPES) ×3 IMPLANT
DRAPE ORTHO SPLIT 77X108 STRL (DRAPES) ×2
DRAPE SURG ORHT 6 SPLT 77X108 (DRAPES) ×2 IMPLANT
DRAPE U-SHAPE 47X51 STRL (DRAPES) ×3 IMPLANT
ELECT CAUTERY BLADE 6.4 (BLADE) ×3 IMPLANT
ELECT REM PT RETURN 9FT ADLT (ELECTROSURGICAL) ×2
ELECTRODE REM PT RTRN 9FT ADLT (ELECTROSURGICAL) ×2 IMPLANT
FIXATION ZIPTIGHT ANKLE SNDSMS (Ankle) IMPLANT
GAUZE SPONGE 4X4 12PLY STRL (GAUZE/BANDAGES/DRESSINGS) ×3 IMPLANT
GAUZE XEROFORM 1X8 LF (GAUZE/BANDAGES/DRESSINGS) ×3 IMPLANT
GLOVE BIO SURGEON STRL SZ8 (GLOVE) ×6 IMPLANT
GLOVE SURG UNDER LTX SZ8 (GLOVE) ×3 IMPLANT
GOWN STRL REUS W/ TWL LRG LVL3 (GOWN DISPOSABLE) ×2 IMPLANT
GOWN STRL REUS W/ TWL XL LVL3 (GOWN DISPOSABLE) ×2 IMPLANT
GOWN STRL REUS W/TWL LRG LVL3 (GOWN DISPOSABLE) ×2
GOWN STRL REUS W/TWL XL LVL3 (GOWN DISPOSABLE) ×2
KIT TURNOVER KIT A (KITS) ×3 IMPLANT
MANIFOLD NEPTUNE II (INSTRUMENTS) ×3 IMPLANT
NS IRRIG 1000ML POUR BTL (IV SOLUTION) ×3 IMPLANT
PACK EXTREMITY ARMC (MISCELLANEOUS) ×3 IMPLANT
PAD ABD DERMACEA PRESS 5X9 (GAUZE/BANDAGES/DRESSINGS) ×6 IMPLANT
PAD CAST CTTN 4X4 STRL (SOFTGOODS) ×4 IMPLANT
PAD PREP 24X41 OB/GYN DISP (PERSONAL CARE ITEMS) ×3 IMPLANT
PADDING CAST COTTON 4X4 STRL (SOFTGOODS) ×4
SPLINT CAST 1 STEP 4X30 (MISCELLANEOUS) ×6 IMPLANT
SPONGE T-LAP 18X18 ~~LOC~~+RFID (SPONGE) ×3 IMPLANT
STAPLER SKIN PROX 35W (STAPLE) ×3 IMPLANT
STOCKINETTE IMPERV 14X48 (MISCELLANEOUS) ×3 IMPLANT
SYR 10ML LL (SYRINGE) ×3 IMPLANT
WATER STERILE IRR 500ML POUR (IV SOLUTION) ×3 IMPLANT
ZIPTIGHT ANKLE SYNODESMOSS FIX (Ankle) ×4 IMPLANT

## 2021-08-05 NOTE — H&P (Signed)
History of Present Illness: Antonio Whitney is a 66 y.o. male who presents today for evaluation of a left ankle fracture that occurred Saturday, 08/02/2021. Patient was climbing a ladder cleaning his gutters and fell approximately 6 feet landing on his left leg, he suffered a Maisonneuve fracture to the left lower extremity with a proximal fibula fracture and syndesmosis rupture. Patient had a closed reduction and splinting of the ankle in the emergency department. His pain is well controlled. He has been nonweightbearing with crutches. Has a little bit of bruising to his right thigh but denies any pain and states bruising is improving. He denies any numbness or tingling left lower extremity. Denies any wounds or skin breakdown.  Past Medical History: History reviewed. No pertinent past medical history.  Past Surgical History:  APPENDECTOMY   REPAIR INGUINAL HERNIA   Past Family History: No family history on file.  Medications:  ACCU-CHEK GUIDE TEST STRIPS test strip   allopurinoL (ZYLOPRIM) 300 MG tablet TAKE 1 TABLET(300 MG) BY MOUTH DAILY   amLODIPine-benazepril (LOTREL) 5-20 mg capsule Take 1 capsule by mouth once daily   dapagliflozin propanediol (FARXIGA) 10 mg tablet Take 10 mg by mouth daily with breakfast   dextroamphetamine-amphetamine (ADDERALL) 10 mg tablet Take 1 tablet by mouth once daily   finasteride (PROSCAR) 5 mg tablet TAKE ONE-HALF TABLET BY MOUTH EVERY OTHER DAY   metFORMIN (GLUCOPHAGE) 500 MG tablet Take 1 tablet by mouth daily with breakfast   simvastatin (ZOCOR) 20 MG tablet TAKE 1 TABLET(20 MG) BY MOUTH AT BEDTIME   cholecalciferol (VITAMIN D3) 2,000 unit capsule Take by mouth   DOCOSAHEXAENOIC ACID ORAL Take by mouth   Allergies: No Known Allergies   Review of Systems:  A comprehensive 14 point ROS was performed, reviewed by me today, and the pertinent orthopaedic findings are documented in the HPI.  Physical Exam: There were no vitals taken for this  visit. General:  Well developed, well nourished, no apparent distress, normal affect, ambulates with crutches HEENT: Head normocephalic, atraumatic, PERRL.   Abdomen: Soft, non tender, non distended, Bowel sounds present.  Heart: Examination of the heart reveals regular, rate, and rhythm. There is no murmur noted on ascultation. There is a normal apical pulse.  Lungs: Lungs are clear to auscultation. There is no wheeze, rhonchi, or crackles. There is normal expansion of bilateral chest walls.   Left lower extremity:  Left lower extremity in a well fitted posterior stirrup splint, splint is intact with Ace wrap. 2+ cap refill throughout the toes, sensation is intact distally. No skin breakdown noted. Normal knee range of motion no knee swelling or effusion.  X-rays of the left ankle and left tibia and fibula are reviewed by me today from the ER on 08/02/2021 impression: Patient has syndesmosis rupture of the left ankle with increased widening of the medial tibiotalar joint space. There is also a an oblique minimally displaced proximal fibular shaft fracture present.  Impression: 1. Closed displaced Maisonneuve fracture of left lower extremity. 2. Ankle syndesmosis disruption, left.  Plan:  The treatment options, including both surgical and nonsurgical choices, have been discussed in detail with the patient. He would like to proceed with surgical intervention to include a reduction and stabilization of the syndesmosis of his left ankle. The risks (including bleeding, infection, nerve and/or blood vessel injury, persistent or recurrent pain, loosening or failure of the components, leg length inequality, dislocation, need for further surgery, blood clots, strokes, heart attacks or arrhythmias, pneumonia, etc.) and benefits of the  surgical procedure were discussed. The patient states his understanding and agrees to proceed. A formal written consent will be obtained by the nursing staff.    H&P  reviewed and patient re-examined. No changes.

## 2021-08-05 NOTE — Telephone Encounter (Signed)
Requested medication (s) are due for refill today - yes  Requested medication (s) are on the active medication list -yes  Future visit scheduled -yes  Last refill: 04/09/21 #30 - 3 Rx given  Notes to clinic: non delegated Rx  Requested Prescriptions  Pending Prescriptions Disp Refills   amphetamine-dextroamphetamine (ADDERALL) 10 MG tablet [Pharmacy Med Name: AMPHETAMINE SALTS 10MG  TAB(GEN ADDERALL)] 30 tablet 0    Sig: TAKE ONE TABLET BY MOUTH ONE TIME DAILY     Not Delegated - Psychiatry:  Stimulants/ADHD Failed - 08/04/2021 11:39 AM      Failed - This refill cannot be delegated      Failed - Urine Drug Screen completed in last 360 days      Passed - Last BP in normal range    BP Readings from Last 1 Encounters:  08/02/21 114/70         Passed - Last Heart Rate in normal range    Pulse Readings from Last 1 Encounters:  08/02/21 83         Passed - Valid encounter within last 6 months    Recent Outpatient Visits           3 months ago Viral URI   [facility] Maple Hudson., MD   7 months ago Annual physical exam   [facility] Maple Hudson., MD   1 year ago Diabetes mellitus without complication [facility])   [facility] Maple Hudson., MD   1 year ago Diabetes mellitus without complication [facility])   [facility] Maple Hudson., MD   1 year ago Annual physical exam   [facility] Maple Hudson., MD       Future Appointments             In 2 weeks Maple Hudson., MD [facility], [facility]               Requested Prescriptions  Pending Prescriptions Disp Refills   amphetamine-dextroamphetamine (ADDERALL) 10 MG tablet [Pharmacy Med Name: AMPHETAMINE SALTS 10MG  TAB(GEN ADDERALL)] 30 tablet 0    Sig: TAKE ONE TABLET BY MOUTH ONE TIME DAILY     Not Delegated - Psychiatry:  Stimulants/ADHD Failed - 08/04/2021 11:39 AM      Failed - This  refill cannot be delegated      Failed - Urine Drug Screen completed in last 360 days      Passed - Last BP in normal range    BP Readings from Last 1 Encounters:  08/02/21 114/70         Passed - Last Heart Rate in normal range    Pulse Readings from Last 1 Encounters:  08/02/21 83         Passed - Valid encounter within last 6 months    Recent Outpatient Visits           3 months ago Viral URI   [facility] Maple Hudson., MD   7 months ago Annual physical exam   [facility] Maple Hudson[facility] MD   1 year ago Diabetes mellitus without complication [facility])   [facility] Maple Hudson., MD   1 year ago Diabetes mellitus without complication [facility])   [facility] Maple Hudson., MD   1 year ago Annual physical exam   [facility] Maple Hudson., MD  Future Appointments             In 2 weeks Maple Hudson., MD [facility]

## 2021-08-05 NOTE — Transfer of Care (Signed)
Immediate Anesthesia Transfer of Care Note  Patient: GIO LOCKE  Procedure(s) Performed: SYNDESMOTIC STABILIZATION OF MAISONNEUVE FRACTURE, LEFT ANKLE (Left: Ankle)  Patient Location: PACU  Anesthesia Type:GA combined with regional for post-op pain  Level of Consciousness: awake, drowsy and patient cooperative  Airway & Oxygen Therapy: Patient Spontanous Breathing and Patient connected to face mask oxygen  Post-op Assessment: Report given to RN and Post -op Vital signs reviewed and stable  Post vital signs: Reviewed and stable  Last Vitals:  Vitals Value Taken Time  BP 115/73 08/05/21 1405  Temp 36.1 C 08/05/21 1405  Pulse 74 08/05/21 1411  Resp 16 08/05/21 1411  SpO2 95 % 08/05/21 1411  Vitals shown include unvalidated device data.  Last Pain:  Vitals:   08/05/21 1147  TempSrc:   PainSc: 0-No pain         Complications: No notable events documented.

## 2021-08-06 ENCOUNTER — Encounter: Payer: Self-pay | Admitting: Surgery

## 2021-08-06 NOTE — Telephone Encounter (Signed)
Refused Accu-Chek strips because this is a duplicate request.  Went to [facility]. 07/25/2021 at 7:15 AM

## 2021-08-06 NOTE — Anesthesia Postprocedure Evaluation (Signed)
Anesthesia Post Note  Patient: Antonio Whitney  Procedure(s) Performed: SYNDESMOTIC STABILIZATION OF MAISONNEUVE FRACTURE, LEFT ANKLE (Left: Ankle)  Patient location during evaluation: PACU Anesthesia Type: General Level of consciousness: awake and alert Pain management: pain level controlled Vital Signs Assessment: post-procedure vital signs reviewed and stable Respiratory status: spontaneous breathing, nonlabored ventilation, respiratory function stable and patient connected to nasal cannula oxygen Cardiovascular status: blood pressure returned to baseline and stable Postop Assessment: no apparent nausea or vomiting Anesthetic complications: no   No notable events documented.   Last Vitals:  Vitals:   08/05/21 1501 08/05/21 1521  BP: 120/69 116/72  Pulse: 78 75  Resp: 16 16  Temp: (!) 36.1 C   SpO2: 95% 96%    Last Pain:  Vitals:   08/05/21 1521  TempSrc:   PainSc: 0-No pain                 Cleda Mccreedy Samariyah Cowles

## 2021-08-20 ENCOUNTER — Ambulatory Visit: Payer: BC Managed Care – PPO | Admitting: Family Medicine

## 2021-08-20 ENCOUNTER — Telehealth (INDEPENDENT_AMBULATORY_CARE_PROVIDER_SITE_OTHER): Payer: Medicare Other | Admitting: Family Medicine

## 2021-08-20 DIAGNOSIS — S82892A Other fracture of left lower leg, initial encounter for closed fracture: Secondary | ICD-10-CM

## 2021-08-20 DIAGNOSIS — E119 Type 2 diabetes mellitus without complications: Secondary | ICD-10-CM

## 2021-08-20 DIAGNOSIS — F988 Other specified behavioral and emotional disorders with onset usually occurring in childhood and adolescence: Secondary | ICD-10-CM

## 2021-08-20 MED ORDER — AMPHETAMINE-DEXTROAMPHETAMINE 10 MG PO TABS: 10.0000 mg | ORAL_TABLET | ORAL | 0 refills | Status: DC

## 2021-08-20 NOTE — Progress Notes (Signed)
    Established patient visit Virtual Visit via Video Note  I connected with Antonio Whitney on 08/21/21 at  2:00 PM EDT by a video enabled telemedicine application and verified that I am speaking with the correct person using two identifiers.  Location: Patient: Home Provider:.   I discussed the limitations of evaluation and management by telemedicine and the availability of in person appointments. The patient expressed understanding and agreed to proceed.  History of Present Illness: Patient is well-known to me and has a history of diabetes and ADD hypertension suffered an ankle fracture and had to have surgery on June 26 by Dr. Poggi.  He is at home recuperating but is still not supposed to bear any weight so we are doing this very at the a video visit.  He needs  refill on his Adderall.  He is able to work from home so continues to work regularly.   Observations/Objective: Patient is alert and oriented and in no acute distress at all.  Assessment and Plan: 1. Attention deficit disorder, unspecified hyperactivity presence Unable to work from home.  Follow-up in 3 to 4 months - amphetamine-dextroamphetamine (ADDERALL) 10 MG tablet; Take 1 tablet (10 mg total) by mouth every morning.  Dispense: 30 tablet; Refill: 0 - amphetamine-dextroamphetamine (ADDERALL) 10 MG tablet; Take 1 tablet (10 mg total) by mouth every morning.  Dispense: 30 tablet; Refill: 0 - amphetamine-dextroamphetamine (ADDERALL) 10 MG tablet; Take 1 tablet (10 mg total) by mouth every morning.  Dispense: 30 tablet; Refill: 0  2. Diabetes mellitus without complication (HCC) A1C on next visit  3. Closed fracture of left ankle, initial encounter Followed by orthopedic   Follow Up Instructions:    I discussed the assessment and treatment plan with the patient. The patient was provided an opportunity to ask questions and all were answered. The patient agreed with the plan and demonstrated an understanding of the  instructions.   The patient was advised to call back or seek an in-person evaluation if the symptoms worsen or if the condition fails to improve as anticipated.  I provided 15 minutes of non-face-to-face time during this encounter.   Richard Gilbert Jr, MD   Patient: Antonio Whitney   DOB: 12/02/1955   66 y.o. Male  MRN: [id number] Visit Date: 08/20/2021  Today's healthcare provider: Richard Gilbert Jr, MD   No chief complaint on file.  Subjective    HPI  Follow up for Attention deficit disorder:  The patient was last seen for this 4 months ago. Changes made at last visit include refilled Adderall. -----------------------------------------------------------------------------------------   Medications: Outpatient Medications Prior to Visit  Medication Sig   Accu-Chek Softclix Lancets lancets CHECK BLOOD SUGAR ONCE DAILY   allopurinol (ZYLOPRIM) 300 MG tablet TAKE 1 TABLET(300 MG) BY MOUTH DAILY   amLODipine-benazepril (LOTREL) 5-20 MG capsule Take 1 capsule by mouth daily.   amphetamine-dextroamphetamine (ADDERALL) 10 MG tablet TAKE ONE TABLET BY MOUTH ONE TIME DAILY IN THE MORNING WITH BREAKFAST   blood glucose meter kit and supplies Dispense based on patient and insurance preference. Check blood sugars every morning before breakfast. (FOR ICD-10 E11.9).   Cholecalciferol (VITAMIN D) 50 MCG (2000 UT) CAPS Take by mouth.   dapagliflozin propanediol (FARXIGA) 10 MG TABS tablet Take 1 tablet (10 mg total) by mouth daily with breakfast.   finasteride (PROSCAR) 5 MG tablet TAKE ONE-HALF TABLET BY MOUTH EVERY OTHER DAY   glucose blood (ACCU-CHEK GUIDE) test strip Check blood sugar once daily     HYDROcodone-acetaminophen (NORCO) 5-325 MG tablet Take 1-2 tablets by mouth every 6 (six) hours as needed for moderate pain or severe pain. MAXIMUM TOTAL ACETAMINOPHEN DOSE IS 4000 MG PER DAY   ibuprofen (ADVIL) 200 MG tablet Take 200 mg by mouth every 6 (six) hours as needed for mild pain.    Inulin (METAMUCIL CLEAR & NATURAL) POWD Take by mouth.   metFORMIN (GLUCOPHAGE) 500 MG tablet TAKE ONE TABLET BY MOUTH ONE TIME DAILY WITH BREAKFAST   Multiple Vitamins-Minerals (CENTRUM SILVER 50+MEN PO) daily.   NON FORMULARY CPAP at night   Omega-3 Fatty Acids (FISH OIL) 1200 MG CAPS Take by mouth.   simvastatin (ZOCOR) 20 MG tablet TAKE 1 TABLET(20 MG) BY MOUTH AT BEDTIME   No facility-administered medications prior to visit.    Review of Systems  Last hemoglobin A1c Lab Results  Component Value Date   HGBA1C 7.5 (A) 04/09/2021       Objective    There were no vitals taken for this visit.    Physical Exam    No results found for any visits on 08/20/21.  Assessment & Plan       No follow-ups on file.         Richard Cranford Mon, MD  [facility] [phone] (phone) [phone] (fax)  [place]

## 2021-08-24 ENCOUNTER — Other Ambulatory Visit: Payer: Self-pay | Admitting: Family Medicine

## 2021-08-24 DIAGNOSIS — F988 Other specified behavioral and emotional disorders with onset usually occurring in childhood and adolescence: Secondary | ICD-10-CM

## 2021-08-28 ENCOUNTER — Other Ambulatory Visit: Payer: Self-pay | Admitting: Family Medicine

## 2021-08-28 DIAGNOSIS — F988 Other specified behavioral and emotional disorders with onset usually occurring in childhood and adolescence: Secondary | ICD-10-CM

## 2021-12-10 ENCOUNTER — Encounter: Payer: Medicare Other | Admitting: Family Medicine

## 2022-01-06 ENCOUNTER — Encounter: Payer: Medicare Other | Admitting: Family Medicine

## 2022-01-23 ENCOUNTER — Ambulatory Visit (INDEPENDENT_AMBULATORY_CARE_PROVIDER_SITE_OTHER): Payer: Medicare PPO | Admitting: Family Medicine

## 2022-01-23 ENCOUNTER — Encounter: Payer: Self-pay | Admitting: Family Medicine

## 2022-01-23 VITALS — BP 131/72 | HR 90 | Temp 98.0°F | Resp 16 | Ht 67.0 in | Wt 196.0 lb

## 2022-01-23 DIAGNOSIS — G4739 Other sleep apnea: Secondary | ICD-10-CM

## 2022-01-23 DIAGNOSIS — E559 Vitamin D deficiency, unspecified: Secondary | ICD-10-CM

## 2022-01-23 DIAGNOSIS — Z0000 Encounter for general adult medical examination without abnormal findings: Secondary | ICD-10-CM | POA: Diagnosis not present

## 2022-01-23 DIAGNOSIS — R739 Hyperglycemia, unspecified: Secondary | ICD-10-CM

## 2022-01-23 DIAGNOSIS — Z79899 Other long term (current) drug therapy: Secondary | ICD-10-CM

## 2022-01-23 DIAGNOSIS — I10 Essential (primary) hypertension: Secondary | ICD-10-CM

## 2022-01-23 DIAGNOSIS — E7800 Pure hypercholesterolemia, unspecified: Secondary | ICD-10-CM

## 2022-01-23 DIAGNOSIS — F988 Other specified behavioral and emotional disorders with onset usually occurring in childhood and adolescence: Secondary | ICD-10-CM

## 2022-01-23 DIAGNOSIS — Z1211 Encounter for screening for malignant neoplasm of colon: Secondary | ICD-10-CM

## 2022-01-23 DIAGNOSIS — E119 Type 2 diabetes mellitus without complications: Secondary | ICD-10-CM

## 2022-01-23 MED ORDER — AMPHETAMINE-DEXTROAMPHETAMINE 10 MG PO TABS
10.0000 mg | ORAL_TABLET | ORAL | 0 refills | Status: DC
Start: 2022-01-23 — End: 2022-03-01

## 2022-01-23 MED ORDER — AMLODIPINE BESY-BENAZEPRIL HCL 5-20 MG PO CAPS: 1.0000 | ORAL_CAPSULE | Freq: Every day | ORAL | 1 refills | Status: DC

## 2022-01-23 MED ORDER — METFORMIN HCL 500 MG PO TABS: ORAL_TABLET | ORAL | 1 refills | Status: DC

## 2022-01-23 NOTE — Assessment & Plan Note (Signed)
DM, stable  A1c repeat ordered, last was 7.5, not yet at goal of less than 7  Next A1c due in 3 months  Foot exam completed today  Eye exam UTD  Microalbumin ordered  On ACE/ARB & statin (simvastatin) therapy

## 2022-01-23 NOTE — Assessment & Plan Note (Addendum)
Initial wellness exam since converting to Medicare  Patient needs colonoscopy referral  Recommended Shingrix and PNA 20 vaccine  He reports being up to date on covid vaccine

## 2022-01-23 NOTE — Assessment & Plan Note (Signed)
Will order UDS  Refills provided for 30 day supply  Recommended 3 month follow up for medication management  PDMP reviewed and appropriate

## 2022-01-23 NOTE — Assessment & Plan Note (Signed)
Stable  Chronic  Will measure vitamin D levels

## 2022-01-23 NOTE — Assessment & Plan Note (Signed)
Controlled BP at goal Continue amlodipine-benazepril 5-20mg  daily  No medications changes today  CMP ordered, patient will return within the next week for fasting labs

## 2022-01-23 NOTE — Assessment & Plan Note (Signed)
Stable  Continue current statin therapy,simvastatin 20mg  daily  Will order fasting lipid panel

## 2022-01-23 NOTE — Assessment & Plan Note (Signed)
CMP and A1c ordered.

## 2022-01-23 NOTE — Progress Notes (Signed)
I,[name],acting as a scribe for [employer], MD.,have documented all relevant documentation on the behalf of [name], MD,as directed by  [name], MD while in the presence of [name], MD.   Annual Wellness Visit     Patient: Antonio Whitney, Male    DOB: 08-10-1955, 66 y.o.   MRN: [id number] Visit Date: 01/23/2022  Today's Provider: [name], MD   Chief Complaint  Patient presents with   Annual Exam   Subjective    Antonio Whitney is a 66 y.o. male who presents today for his Annual Wellness Visit. He reports consuming a  well balanced  diet. Home exercise routine includes walks 2 miles daily and uses stationary bike. He generally feels well. He reports sleeping well. He does not have additional problems to discuss today.   HPI    Medications: Outpatient Medications Prior to Visit  Medication Sig   Accu-Chek Softclix Lancets lancets CHECK BLOOD SUGAR ONCE DAILY   allopurinol (ZYLOPRIM) 300 MG tablet TAKE 1 TABLET(300 MG) BY MOUTH DAILY   blood glucose meter kit and supplies Dispense based on patient and insurance preference. Check blood sugars every morning before breakfast. (FOR ICD-10 E11.9).   Cholecalciferol (VITAMIN D) 50 MCG (2000 UT) CAPS Take by mouth.   dapagliflozin propanediol (FARXIGA) 10 MG TABS tablet Take 1 tablet (10 mg total) by mouth daily with breakfast.   finasteride (PROSCAR) 5 MG tablet TAKE ONE-HALF TABLET BY MOUTH EVERY OTHER DAY   glucose blood (ACCU-CHEK GUIDE) test strip CHECK BLOOD SUGARS EVERY MORNING BEFORE BREAKFAST   ibuprofen (ADVIL) 200 MG tablet Take 200 mg by mouth every 6 (six) hours as needed for mild pain.   Inulin (METAMUCIL CLEAR & NATURAL) POWD Take by mouth.   Multiple Vitamins-Minerals (CENTRUM SILVER 50+MEN PO) daily.   NON FORMULARY CPAP at night   Omega-3 Fatty Acids (FISH OIL PO) Take 1,400 mg by mouth.   simvastatin (ZOCOR) 20 MG tablet  TAKE 1 TABLET(20 MG) BY MOUTH AT BEDTIME   Zinc 30 MG TABS Take by mouth.   [DISCONTINUED] amLODipine-benazepril (LOTREL) 5-20 MG capsule Take 1 capsule by mouth daily.   [DISCONTINUED] amphetamine-dextroamphetamine (ADDERALL) 10 MG tablet Take 1 tablet (10 mg total) by mouth every morning.   [DISCONTINUED] metFORMIN (GLUCOPHAGE) 500 MG tablet TAKE ONE TABLET BY MOUTH ONE TIME DAILY WITH BREAKFAST   amphetamine-dextroamphetamine (ADDERALL) 10 MG tablet Take 1 tablet (10 mg total) by mouth every morning.   amphetamine-dextroamphetamine (ADDERALL) 10 MG tablet Take 1 tablet (10 mg total) by mouth every morning.   HYDROcodone-acetaminophen (NORCO) 5-325 MG tablet Take 1-2 tablets by mouth every 6 (six) hours as needed for moderate pain or severe pain. MAXIMUM TOTAL ACETAMINOPHEN DOSE IS 4000 MG PER DAY   No facility-administered medications prior to visit.    No Known Allergies  Patient Care Team: Jerrol Banana., MD as PCP - General (Family Medicine)  Review of Systems  All other systems reviewed and are negative.       Objective    Vitals: BP 131/72 (BP Location: Left Arm, Patient Position: Sitting, Cuff Size: Large)   Pulse 90   Temp 98 F (36.7 C) (Oral)   Resp 16   Ht _0  (1.702 m)   Wt 196 lb (88.9 kg)   BMI 30.70 kg/m     Physical Exam Vitals reviewed.  Constitutional:      General: He is not in acute distress.    Appearance: Normal  appearance. He is not ill-appearing, toxic-appearing or diaphoretic.  HENT:     Head: Normocephalic and atraumatic.     Right Ear: Tympanic membrane and external ear normal.     Left Ear: Tympanic membrane and external ear normal.     Nose: Nose normal.     Mouth/Throat:     Mouth: Mucous membranes are moist.     Pharynx: No oropharyngeal exudate or posterior oropharyngeal erythema.  Eyes:     General: No scleral icterus.    Extraocular Movements: Extraocular movements intact.     Conjunctiva/sclera: Conjunctivae normal.      Pupils: Pupils are equal, round, and reactive to light.  Neck:     Vascular: No carotid bruit.  Cardiovascular:     Rate and Rhythm: Normal rate and regular rhythm.     Pulses: Normal pulses.          Dorsalis pedis pulses are 2+ on the right side and 2+ on the left side.       Posterior tibial pulses are 2+ on the right side and 2+ on the left side.     Heart sounds: Normal heart sounds. No murmur heard.    No friction rub. No gallop.  Pulmonary:     Effort: Pulmonary effort is normal. No respiratory distress.     Breath sounds: Normal breath sounds. No stridor. No wheezing, rhonchi or rales.  Abdominal:     General: Bowel sounds are normal. There is no distension.     Palpations: Abdomen is soft.     Tenderness: There is no abdominal tenderness.  Musculoskeletal:        General: No swelling, tenderness or signs of injury. Normal range of motion.     Cervical back: Normal range of motion and neck supple. No rigidity or tenderness.     Right lower leg: No edema.     Left lower leg: Edema present.     Right foot: Normal range of motion. No deformity or bunion.     Left foot: Normal range of motion. No deformity or bunion.  Feet:     Right foot:     Protective Sensation: 6 sites tested.  6 sites sensed.     Skin integrity: Skin integrity normal. No erythema.     Toenail Condition: Right toenails are normal.     Left foot:     Protective Sensation: 6 sites tested.  6 sites sensed.     Skin integrity: Skin integrity normal. No erythema.     Toenail Condition: Left toenails are normal.  Lymphadenopathy:     Cervical: No cervical adenopathy.  Skin:    General: Skin is warm and dry.     Capillary Refill: Capillary refill takes less than 2 seconds.     Findings: No erythema or rash.  Neurological:     Mental Status: He is alert and oriented to person, place, and time.     Motor: No weakness.     Gait: Gait normal.  Psychiatric:        Attention and Perception: Attention normal.         Mood and Affect: Mood normal.        Speech: Speech normal.        Behavior: Behavior normal. Behavior is cooperative.        Thought Content: Thought content normal.     Most recent functional status assessment:    01/23/2022    2:37 PM  In your present state of  health, do you have any difficulty performing the following activities:  Hearing? 0  Vision? 0  Difficulty concentrating or making decisions? 0  Walking or climbing stairs? 0  Dressing or bathing? 0  Doing errands, shopping? 0   Most recent fall risk assessment:    01/23/2022    2:36 PM  Fall Risk   Falls in the past year? 0  Number falls in past yr: 0  Injury with Fall? 0  Risk for fall due to : No Fall Risks    Most recent depression screenings:    01/23/2022    2:37 PM 04/09/2021    2:47 PM  PHQ 2/9 Scores  PHQ - 2 Score 0 0  PHQ- 9 Score  0   Most recent cognitive screening:     No data to display         Most recent Audit-C alcohol use screening    01/23/2022    2:37 PM  Alcohol Use Disorder Test (AUDIT)  1. How often do you have a drink containing alcohol? 0  2. How many drinks containing alcohol do you have on a typical day when you are drinking? 0  3. How often do you have six or more drinks on one occasion? 0  AUDIT-C Score 0   A score of 3 or more in women, and 4 or more in men indicates increased risk for alcohol abuse, EXCEPT if all of the points are from question 1   No results found for any visits on 01/23/22.  Assessment & Plan     Annual wellness visit done today including the all of the following: Reviewed patient's Family Medical History Reviewed and updated list of patient's medical providers Assessment of cognitive impairment was done Assessed patient's functional ability Established a written schedule for health screening [place] Completed and Reviewed  Exercise Activities and Dietary recommendations  Goals   None     Immunization  History  Administered Date(s) Administered   Fluad Quad(high Dose 65+) 12/09/2020   Influenza,inj,Quad PF,6+ Mos 10/11/2014, 12/14/2015, 10/22/2016, 12/01/2017, 12/07/2018, 12/12/2019   PFIZER Comirnaty(Gray Top)Covid-19 Tri-Sucrose Vaccine 07/23/2020   PFIZER(Purple Top)SARS-COV-2 Vaccination 05/01/2019, 05/26/2019, 12/24/2019   PNEUMOCOCCAL CONJUGATE-20 01/23/2022   Pneumococcal Polysaccharide-23 10/22/2016   Tdap 07/01/2011, 12/09/2020   Zoster, Live 10/16/2015    Health Maintenance  Topic Date Due   Diabetic kidney evaluation - Urine ACR  Never done   Zoster Vaccines- Shingrix (1 of 2) Never done   COLONOSCOPY (Pts 45-22yr Insurance coverage will need to be confirmed)  03/02/2021   INFLUENZA VACCINE  09/09/2021   HEMOGLOBIN A1C  10/10/2021   COVID-19 Vaccine (5 - 2023-24 season) 10/10/2021   OPHTHALMOLOGY EXAM  07/26/2022   Diabetic kidney evaluation - eGFR measurement  08/06/2022   FOOT EXAM  01/24/2023   Medicare Annual Wellness (AWV)  01/24/2023   DTaP/Tdap/Td (3 - Td or Tdap) 12/10/2030   Pneumonia Vaccine 66 Years old  Completed   Hepatitis C Screening  Completed   HPV VACCINES  Aged Out     Discussed health benefits of physical activity, and encouraged him to engage in regular exercise appropriate for his age and condition.    Problem List Items Addressed This Visit       Cardiovascular and Mediastinum   Essential (primary) hypertension    Controlled BP at goal Continue amlodipine-benazepril 5-254mdaily  No medications changes today  CMP ordered, patient will return within the next week for fasting labs  Relevant Medications   amLODipine-benazepril (LOTREL) 5-20 MG capsule   Other Relevant Orders   Comprehensive metabolic panel     Respiratory   Apnea, sleep    Reports using nightly CPAP  Stable and chronic issue         Endocrine   Diabetes mellitus without complication (HCC)    DM, stable  A1c repeat ordered, last was 7.5, not yet at  goal of less than 7  Next A1c due in 3 months  Foot exam completed today  Eye exam UTD  Microalbumin ordered  On ACE/ARB & statin (simvastatin) therapy          Relevant Medications   amLODipine-benazepril (LOTREL) 5-20 MG capsule   metFORMIN (GLUCOPHAGE) 500 MG tablet   Other Relevant Orders   Hemoglobin A1c   Urine microalbumin-creatinine with uACR   Pneumococcal conjugate vaccine 20-valent (Prevnar 20) (Completed)     Other   ADD (attention deficit disorder)    Will order UDS  Refills provided for 30 day supply  Recommended 3 month follow up for medication management  PDMP reviewed and appropriate       Relevant Medications   amphetamine-dextroamphetamine (ADDERALL) 10 MG tablet   Other Relevant Orders   Pain Management Screening Profile (10S)   Elevated blood sugar    CMP and A1c ordered       Hypercholesteremia    Stable  Continue current statin therapy,simvastatin 29m daily  Will order fasting lipid panel       Relevant Medications   amLODipine-benazepril (LOTREL) 5-20 MG capsule   Other Relevant Orders   Lipid panel   Avitaminosis D    Stable  Chronic  Will measure vitamin D levels       Relevant Orders   Vitamin D (25 hydroxy)   Encounter for annual wellness exam in Medicare patient - Primary    Initial wellness exam since converting to Medicare  Patient needs colonoscopy referral  Recommended Shingrix and PNA 20 vaccine  He reports being up to date on covid vaccine         Other Visit Diagnoses     On stimulant medication       Relevant Orders   Pain Management Screening Profile (10S)   Screening for colon cancer       Relevant Orders   Ambulatory referral to Gastroenterology        Return in about 3 months (around 04/24/2022).    I, MEulis Foster MD, have reviewed all documentation for this visit.  Portions of this information were initially documented by the CMA and reviewed by me for thoroughness and accuracy.       MEulis Foster MD  [phone]) [phone](fax)  [place]

## 2022-01-23 NOTE — Assessment & Plan Note (Signed)
Reports using nightly CPAP  Stable and chronic issue

## 2022-01-23 NOTE — Patient Instructions (Signed)
Health Maintenance After Age 65 After age 65, you are at a higher risk for certain long-term diseases and infections as well as injuries from falls. Falls are a major cause of broken bones and head injuries in people who are older than age 65. Getting regular preventive care can help to keep you healthy and well. Preventive care includes getting regular testing and making lifestyle changes as recommended by your health care provider. Talk with your health care provider about: Which screenings and tests you should have. A screening is a test that checks for a disease when you have no symptoms. A diet and exercise plan that is right for you. What should I know about screenings and tests to prevent falls? Screening and testing are the best ways to find a health problem early. Early diagnosis and treatment give you the best chance of managing medical conditions that are common after age 65. Certain conditions and lifestyle choices may make you more likely to have a fall. Your health care provider may recommend: Regular vision checks. Poor vision and conditions such as cataracts can make you more likely to have a fall. If you wear glasses, make sure to get your prescription updated if your vision changes. Medicine review. Work with your health care provider to regularly review all of the medicines you are taking, including over-the-counter medicines. Ask your health care provider about any side effects that may make you more likely to have a fall. Tell your health care provider if any medicines that you take make you feel dizzy or sleepy. Strength and balance checks. Your health care provider may recommend certain tests to check your strength and balance while standing, walking, or changing positions. Foot health exam. Foot pain and numbness, as well as not wearing proper footwear, can make you more likely to have a fall. Screenings, including: Osteoporosis screening. Osteoporosis is a condition that causes  the bones to get weaker and break more easily. Blood pressure screening. Blood pressure changes and medicines to control blood pressure can make you feel dizzy. Depression screening. You may be more likely to have a fall if you have a fear of falling, feel depressed, or feel unable to do activities that you used to do. Alcohol use screening. Using too much alcohol can affect your balance and may make you more likely to have a fall. Follow these instructions at home: Lifestyle Do not drink alcohol if: Your health care provider tells you not to drink. If you drink alcohol: Limit how much you have to: 0-1 drink a day for women. 0-2 drinks a day for men. Know how much alcohol is in your drink. In the [place] one drink equals one 12 oz bottle of beer (355 mL), one 5 oz glass of wine (148 mL), or one 1 oz glass of hard liquor (44 mL). Do not use any products that contain nicotine or tobacco. These products include cigarettes, chewing tobacco, and vaping devices, such as e-cigarettes. If you need help quitting, ask your health care provider. Activity  Follow a regular exercise program to stay fit. This will help you maintain your balance. Ask your health care provider what types of exercise are appropriate for you. If you need a cane or walker, use it as recommended by your health care provider. Wear supportive shoes that have nonskid soles. Safety  Remove any tripping hazards, such as rugs, cords, and clutter. Install safety equipment such as grab bars in bathrooms and safety rails on stairs. Keep rooms and walkways   well-lit. General instructions Talk with your health care provider about your risks for falling. Tell your health care provider if: You fall. Be sure to tell your health care provider about all falls, even ones that seem minor. You feel dizzy, tiredness (fatigue), or off-balance. Take over-the-counter and prescription medicines only as told by your health care provider. These include  supplements. Eat a healthy diet and maintain a healthy weight. A healthy diet includes low-fat dairy products, low-fat (lean) meats, and fiber from whole grains, beans, and lots of fruits and vegetables. Stay current with your vaccines. Schedule regular health, dental, and eye exams. Summary Having a healthy lifestyle and getting preventive care can help to protect your health and wellness after age 65. Screening and testing are the best way to find a health problem early and help you avoid having a fall. Early diagnosis and treatment give you the best chance for managing medical conditions that are more common for people who are older than age 65. Falls are a major cause of broken bones and head injuries in people who are older than age 65. Take precautions to prevent a fall at home. Work with your health care provider to learn what changes you can make to improve your health and wellness and to prevent falls. This information is not intended to replace advice given to you by your health care provider. Make sure you discuss any questions you have with your health care provider. Document Revised: 06/17/2020 Document Reviewed: 06/17/2020 Elsevier Patient Education  2023 [employer]  

## 2022-01-24 LAB — MED LIST OPTION NOT SELECTED

## 2022-01-26 LAB — PMP SCREEN PROFILE (10S), URINE
Amphetamine Scrn, Ur: POSITIVE ng/mL — AB
BARBITURATE SCREEN URINE: NEGATIVE ng/mL
BENZODIAZEPINE SCREEN, URINE: NEGATIVE ng/mL
CANNABINOIDS UR QL SCN: NEGATIVE ng/mL
Cocaine (Metab) Scrn, Ur: NEGATIVE ng/mL
Creatinine(Crt), U: 70.5 mg/dL (ref 20.0–300.0)
Methadone Screen, Urine: NEGATIVE ng/mL
OXYCODONE+OXYMORPHONE UR QL SCN: NEGATIVE ng/mL
Opiate Scrn, Ur: NEGATIVE ng/mL
Ph of Urine: 5.5 (ref 4.5–8.9)
Phencyclidine Qn, Ur: NEGATIVE ng/mL
Propoxyphene Scrn, Ur: NEGATIVE ng/mL

## 2022-01-28 LAB — LIPID PANEL
Chol/HDL Ratio: 2.2 ratio (ref 0.0–5.0)
Cholesterol, Total: 160 mg/dL (ref 100–199)
HDL: 74 mg/dL (ref >39)
LDL Chol Calc (NIH): 70 mg/dL (ref 0–99)
Triglycerides: 89 mg/dL (ref 0–149)
VLDL Cholesterol Cal: 16 mg/dL (ref 5–40)

## 2022-01-28 LAB — COMPREHENSIVE METABOLIC PANEL
ALT: 25 IU/L (ref 0–44)
AST: 15 IU/L (ref 0–40)
Albumin/Globulin Ratio: 1.7 (ref 1.2–2.2)
Albumin: 4.4 g/dL (ref 3.9–4.9)
Alkaline Phosphatase: 80 IU/L (ref 44–121)
BUN/Creatinine Ratio: 19 (ref 10–24)
BUN: 20 mg/dL (ref 8–27)
Bilirubin Total: 0.6 mg/dL (ref 0.0–1.2)
CO2: 21 mmol/L (ref 20–29)
Calcium: 9.7 mg/dL (ref 8.6–10.2)
Chloride: 106 mmol/L (ref 96–106)
Creatinine, Ser: 1.08 mg/dL (ref 0.76–1.27)
Globulin, Total: 2.6 g/dL (ref 1.5–4.5)
Glucose: 156 mg/dL — ABNORMAL HIGH (ref 70–99)
Potassium: 4.4 mmol/L (ref 3.5–5.2)
Sodium: 144 mmol/L (ref 134–144)
Total Protein: 7 g/dL (ref 6.0–8.5)
eGFR: 76 mL/min/{1.73_m2} (ref >59)

## 2022-01-28 LAB — MICROALBUMIN / CREATININE URINE RATIO
Creatinine, Urine: 77.2 mg/dL
Microalb/Creat Ratio: 5 mg/g creat (ref 0–29)
Microalbumin, Urine: 4 ug/mL

## 2022-01-28 LAB — HEMOGLOBIN A1C
Est. average glucose Bld gHb Est-mCnc: 186 mg/dL
Hgb A1c MFr Bld: 8.1 % — ABNORMAL HIGH (ref 4.8–5.6)

## 2022-01-28 LAB — VITAMIN D 25 HYDROXY (VIT D DEFICIENCY, FRACTURES): Vit D, 25-Hydroxy: 45.3 ng/mL (ref 30.0–100.0)

## 2022-02-28 ENCOUNTER — Other Ambulatory Visit: Payer: Self-pay | Admitting: Family Medicine

## 2022-02-28 DIAGNOSIS — F988 Other specified behavioral and emotional disorders with onset usually occurring in childhood and adolescence: Secondary | ICD-10-CM

## 2022-04-06 ENCOUNTER — Other Ambulatory Visit: Payer: Self-pay | Admitting: Family Medicine

## 2022-04-06 DIAGNOSIS — F988 Other specified behavioral and emotional disorders with onset usually occurring in childhood and adolescence: Secondary | ICD-10-CM

## 2022-04-24 NOTE — Progress Notes (Unsigned)
I,[name],acting as a scribe for [employer], MD.,have documented all relevant documentation on the behalf of Antonio Foster, MD,as directed by  Antonio Foster, MD while in the presence of Antonio Foster, MD.   Established patient visit   Patient: Antonio Whitney   DOB: 1955-05-25   67 y.o. Male  MRN: [id number] Visit Date: 04/27/2022  Today's healthcare provider: [name], MD   No chief complaint on file.  Subjective    HPI  Diabetes Mellitus Type II, Follow-up  Lab Results  Component Value Date   HGBA1C 8.1 (H) 01/27/2022   HGBA1C 7.5 (A) 04/09/2021   HGBA1C 6.8 (H) 12/09/2020   Wt Readings from Last 3 Encounters:  01/23/22 196 lb (88.9 kg)  08/05/21 196 lb 6.9 oz (89.1 kg)  08/02/21 196 lb 6.9 oz (89.1 kg)   Last seen for diabetes 3 months ago.  Management since then includes on ACE/ARB & statin (simvastatin) therapy and Metformin 500 mg. Reports that he is doing well with the [place] He reports excellent compliance with treatment. He is not having side effects.  Symptoms: No fatigue No foot ulcerations  No appetite changes No nausea  No paresthesia of the feet  No polydipsia  No polyuria No visual disturbances   No vomiting     Home blood sugar records: fasting range: 160's-170's  Episodes of hypoglycemia? No    Current insulin regiment: none   Pertinent Labs: Lab Results  Component Value Date   CHOL 160 01/27/2022   HDL 74 01/27/2022   LDLCALC 70 01/27/2022   TRIG 89 01/27/2022   CHOLHDL 2.2 01/27/2022   Lab Results  Component Value Date   NA 144 01/27/2022   K 4.4 01/27/2022   CREATININE 1.08 01/27/2022   EGFR 76 01/27/2022   LABMICR 4.0 01/27/2022   MICRALBCREAT 5 01/27/2022    Hypertension, follow-up  BP Readings from Last 3 Encounters:  04/27/22 112/76  01/23/22 131/72  08/05/21 116/72   Wt Readings from Last 3 Encounters:  04/27/22 196 lb 1.6 oz (89 kg)   01/23/22 196 lb (88.9 kg)  08/05/21 196 lb 6.9 oz (89.1 kg)     He was last seen for hypertension 3 months ago.  BP at that visit was 131/75. Management since that visit includes continue Lotrel-5-20 mg.  He reports excellent compliance with treatment. He spends 30 mins spinning every morning at 5:30 AM and walks with his wife every day weather permitting   Outside blood pressures are not checking every day but when he checks is in between 120/78-80.  Lipid/Cholesterol, Follow-up  Last lipid panel Other pertinent labs  Lab Results  Component Value Date   CHOL 160 01/27/2022   HDL 74 01/27/2022   LDLCALC 70 01/27/2022   TRIG 89 01/27/2022   CHOLHDL 2.2 01/27/2022   Lab Results  Component Value Date   ALT 25 01/27/2022   AST 15 01/27/2022   PLT 262 08/05/2021   TSH 1.350 12/09/2020     He was last seen for this 3 months ago.  Management since that visit includes continue current medication.  Gout: Patient here for follow up for chronic gout. The patient reports no acute gout attacks since last clinic visit. Attacks occur primarily in the knees and ankle. Patient reports his chronic pain is stable, his joint stiffness is stable and his joint swelling is improved. Patient reports adherence to Allopurinol 300mg  daily.   Chronic Sleep Apnea: Patient presents with hx of obstructive  sleep apnea. Patient reports that he is doing well with his cpap machine. Uses his machine every night. Reports that he went ahead and bought his own machine. He is using the [facility].  Attention Deficit Disorder, follow-up  BP Readings from Last 3 Encounters:  04/27/22 112/76  01/23/22 131/72  08/05/21 116/72   Wt Readings from Last 3 Encounters:  04/27/22 196 lb 1.6 oz (89 kg)  01/23/22 196 lb (88.9 kg)  08/05/21 196 lb 6.9 oz (89.1 kg)     He was last seen for attention deficit disorder 3 months ago.  Management since that visit includes continuing adderall 10mg  daily with  breakfast.  He reports excellent compliance with treatment. He is not having side effects.   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    Medications: Outpatient Medications Prior to Visit  Medication Sig   blood glucose meter kit and supplies Dispense based on patient and insurance preference. Check blood sugars every morning before breakfast. (FOR ICD-10 E11.9).   Cholecalciferol (VITAMIN D) 50 MCG (2000 UT) CAPS Take by mouth.   Inulin (METAMUCIL CLEAR & NATURAL) POWD Take by mouth.   Multiple Vitamins-Minerals (CENTRUM SILVER 50+MEN PO) daily.   NON FORMULARY CPAP at night   Omega-3 Fatty Acids (FISH OIL PO) Take 1,400 mg by mouth.   Zinc 30 MG TABS Take by mouth.   [DISCONTINUED] Accu-Chek Softclix Lancets lancets CHECK BLOOD SUGAR ONCE DAILY   [DISCONTINUED] allopurinol (ZYLOPRIM) 300 MG tablet TAKE 1 TABLET(300 MG) BY MOUTH DAILY   [DISCONTINUED] amLODipine-benazepril (LOTREL) 5-20 MG capsule Take 1 capsule by mouth daily.   [DISCONTINUED] amphetamine-dextroamphetamine (ADDERALL) 10 MG tablet TAKE ONE TABLET BY MOUTH EVERY MORNING WITH BREAKFAST   [DISCONTINUED] dapagliflozin propanediol (FARXIGA) 10 MG TABS tablet Take 1 tablet (10 mg total) by mouth daily with breakfast.   [DISCONTINUED] finasteride (PROSCAR) 5 MG tablet TAKE ONE-HALF TABLET BY MOUTH EVERY OTHER DAY   [DISCONTINUED] glucose blood (ACCU-CHEK GUIDE) test strip CHECK BLOOD SUGARS EVERY MORNING BEFORE BREAKFAST   [DISCONTINUED] metFORMIN (GLUCOPHAGE) 500 MG tablet TAKE ONE TABLET BY MOUTH ONE TIME DAILY WITH BREAKFAST   [DISCONTINUED] simvastatin (ZOCOR) 20 MG tablet TAKE 1 TABLET(20 MG) BY MOUTH AT BEDTIME   ibuprofen (ADVIL) 200 MG tablet Take 200 mg by mouth every 6  (six) hours as needed for mild pain.   No facility-administered medications prior to visit.    Review of Systems     Objective    BP 112/76 (BP Location: Left Arm, Patient Position: Sitting, Cuff Size: Large)   Pulse 92   Resp 16   Ht 5\' 7"  (1.702 m)   Wt 196 lb 1.6 oz (89 kg)   BMI 30.71 kg/m    Physical Exam Vitals reviewed.  Constitutional:      General: He is not in acute distress.    Appearance: Normal appearance. He is not ill-appearing, toxic-appearing or diaphoretic.  HENT:     Mouth/Throat:     Lips: No lesions.     Mouth: Mucous membranes are moist.     Pharynx: Oropharynx is clear. No oropharyngeal exudate or posterior oropharyngeal erythema.  Eyes:     Conjunctiva/sclera: Conjunctivae normal.  Neck:     Thyroid: No thyroid mass, thyromegaly  or thyroid tenderness.     Vascular: No carotid bruit.  Cardiovascular:     Rate and Rhythm: Normal rate and regular rhythm.     Pulses: Normal pulses.     Heart sounds: Normal heart sounds. No murmur heard.    No friction rub. No gallop.  Pulmonary:     Effort: Pulmonary effort is normal. No respiratory distress.     Breath sounds: Normal breath sounds. No stridor. No wheezing, rhonchi or rales.  Abdominal:     General: Bowel sounds are normal. There is no distension.     Palpations: Abdomen is soft.     Tenderness: There is no abdominal tenderness.  Musculoskeletal:     Right lower leg: No edema.     Left lower leg: No edema.  Lymphadenopathy:     Cervical: No cervical adenopathy.  Skin:    Findings: No erythema or rash.  Neurological:     Mental Status: He is alert and oriented to person, place, and time.     Results for orders placed or performed in visit on 04/27/22  POCT HgB A1C  Result Value Ref Range   Hemoglobin A1C 7.8 (A) 4.0 - 5.6 %   Est. average glucose Bld gHb Est-mCnc 177     Assessment & Plan     Problem List Items Addressed This Visit       Cardiovascular and Mediastinum    Essential (primary) hypertension    Controlled BP at goal Continue amlodipine-benazepril 5-20mg   No medications changes today        Relevant Medications   amLODipine-benazepril (LOTREL) 5-20 MG capsule   simvastatin (ZOCOR) 20 MG tablet     Respiratory   Apnea, sleep    Chronic problem  Stable  Continue using CPAP nightly           Endocrine   Diabetes mellitus without complication (HCC) - Primary    Chronic Not yet at goal of less than 7  Repeated A1c today, 7.8 Continue Farxiga 10mg  daily, refills provided  Continue 500mg  metformin daily, refills provided  Refilled lancets and test strips  Follow up in 3 months for DM  Adherent to statin regimen with simvastatin 20mg  daily  UTD on eye exam and foot exam         Relevant Medications   amLODipine-benazepril (LOTREL) 5-20 MG capsule   metFORMIN (GLUCOPHAGE) 500 MG tablet   dapagliflozin propanediol (FARXIGA) 10 MG TABS tablet   simvastatin (ZOCOR) 20 MG tablet   Other Relevant Orders   POCT HgB A1C (Completed)     Other   ADD (attention deficit disorder)    Chronic  Symptoms are stable  Refills provided for adderall 10mg  daily, 30 day supply  PDMP reviewed. Last fill on 2/28        Relevant Medications   amphetamine-dextroamphetamine (ADDERALL) 10 MG tablet   glucose blood (ACCU-CHEK GUIDE) test strip   Accu-Chek Softclix Lancets lancets   Gout    Chronic  Stable  No acute flare at this time  Continue allopurinol 300mg  daily  Refills provided today        Relevant Medications   allopurinol (ZYLOPRIM) 300 MG tablet   Hypercholesteremia   Relevant Medications   amLODipine-benazepril (LOTREL) 5-20 MG capsule   simvastatin (ZOCOR) 20 MG tablet   Other Visit Diagnoses     Benign prostatic hyperplasia without lower urinary tract symptoms       Relevant Medications   finasteride (PROSCAR) 5 MG tablet  Return in about 3 months (around 07/28/2022) for DM, HTN, ADD .        The  entirety of the information documented in the History of Present Illness, Review of Systems and Physical Exam were personally obtained by me. Portions of this information were initially documented by Lyndel Pleasure, CMA. I, Antonio Foster, MD have reviewed the documentation above for thoroughness and accuracy.      Antonio Foster, MD  [facility] [phone] (phone) [phone] (fax)  [place]

## 2022-04-27 ENCOUNTER — Encounter: Payer: Self-pay | Admitting: Family Medicine

## 2022-04-27 ENCOUNTER — Ambulatory Visit: Payer: Medicare PPO | Admitting: Family Medicine

## 2022-04-27 VITALS — BP 112/76 | HR 92 | Resp 16 | Ht 67.0 in | Wt 196.1 lb

## 2022-04-27 DIAGNOSIS — I10 Essential (primary) hypertension: Secondary | ICD-10-CM

## 2022-04-27 DIAGNOSIS — F988 Other specified behavioral and emotional disorders with onset usually occurring in childhood and adolescence: Secondary | ICD-10-CM

## 2022-04-27 DIAGNOSIS — M109 Gout, unspecified: Secondary | ICD-10-CM

## 2022-04-27 DIAGNOSIS — G4739 Other sleep apnea: Secondary | ICD-10-CM | POA: Diagnosis not present

## 2022-04-27 DIAGNOSIS — M1A9XX0 Chronic gout, unspecified, without tophus (tophi): Secondary | ICD-10-CM

## 2022-04-27 DIAGNOSIS — E119 Type 2 diabetes mellitus without complications: Secondary | ICD-10-CM | POA: Diagnosis not present

## 2022-04-27 DIAGNOSIS — N400 Enlarged prostate without lower urinary tract symptoms: Secondary | ICD-10-CM

## 2022-04-27 DIAGNOSIS — E7800 Pure hypercholesterolemia, unspecified: Secondary | ICD-10-CM

## 2022-04-27 LAB — POCT GLYCOSYLATED HEMOGLOBIN (HGB A1C)
Est. average glucose Bld gHb Est-mCnc: 177
Hemoglobin A1C: 7.8 % — AB (ref 4.0–5.6)

## 2022-04-27 MED ORDER — FINASTERIDE 5 MG PO TABS: ORAL_TABLET | ORAL | 11 refills | Status: DC

## 2022-04-27 MED ORDER — SIMVASTATIN 20 MG PO TABS: ORAL_TABLET | ORAL | 3 refills | Status: DC

## 2022-04-27 MED ORDER — ACCU-CHEK SOFTCLIX LANCETS MISC: 12 refills | Status: DC

## 2022-04-27 MED ORDER — ALLOPURINOL 300 MG PO TABS: ORAL_TABLET | ORAL | 3 refills | Status: DC

## 2022-04-27 MED ORDER — DAPAGLIFLOZIN PROPANEDIOL 10 MG PO TABS: 10.0000 mg | ORAL_TABLET | Freq: Every day | ORAL | 3 refills | Status: DC

## 2022-04-27 MED ORDER — AMLODIPINE BESY-BENAZEPRIL HCL 5-20 MG PO CAPS: 1.0000 | ORAL_CAPSULE | Freq: Every day | ORAL | 1 refills | Status: DC

## 2022-04-27 MED ORDER — AMPHETAMINE-DEXTROAMPHETAMINE 10 MG PO TABS: ORAL_TABLET | ORAL | 0 refills | Status: DC

## 2022-04-27 MED ORDER — METFORMIN HCL 500 MG PO TABS: ORAL_TABLET | ORAL | 1 refills | Status: DC

## 2022-04-27 MED ORDER — ACCU-CHEK GUIDE VI STRP: ORAL_STRIP | 11 refills | Status: DC

## 2022-04-27 NOTE — Assessment & Plan Note (Addendum)
Controlled BP at goal Continue amlodipine-benazepril 5-20mg   No medications changes today

## 2022-04-27 NOTE — Assessment & Plan Note (Signed)
Chronic  Symptoms are stable  Refills provided for adderall 10mg  daily, 30 day supply  PDMP reviewed. Last fill on 2/28

## 2022-04-27 NOTE — Assessment & Plan Note (Signed)
Chronic  Stable  No acute flare at this time  Continue allopurinol 300mg  daily  Refills provided today

## 2022-04-27 NOTE — Assessment & Plan Note (Signed)
Chronic problem  Stable  Continue using CPAP nightly

## 2022-04-27 NOTE — Assessment & Plan Note (Signed)
Chronic  Stable  Continue simvastatin 20mg  daily

## 2022-04-27 NOTE — Assessment & Plan Note (Addendum)
Chronic Not yet at goal of less than 7  Repeated A1c today, 7.8 Continue Farxiga 10mg  daily, refills provided  Continue 500mg  metformin daily, refills provided  Refilled lancets and test strips  Follow up in 3 months for DM  Adherent to statin regimen with simvastatin 20mg  daily  UTD on eye exam and foot exam

## 2022-05-04 ENCOUNTER — Telehealth: Payer: Self-pay | Admitting: Family Medicine

## 2022-05-04 ENCOUNTER — Other Ambulatory Visit: Payer: Self-pay

## 2022-05-04 DIAGNOSIS — M109 Gout, unspecified: Secondary | ICD-10-CM

## 2022-05-04 NOTE — Telephone Encounter (Signed)
[facility] pharmacy requesting prescription refill allopurinol (ZYLOPRIM) 300 MG tablet  Please advise

## 2022-06-08 ENCOUNTER — Other Ambulatory Visit: Payer: Self-pay | Admitting: Family Medicine

## 2022-06-08 DIAGNOSIS — F988 Other specified behavioral and emotional disorders with onset usually occurring in childhood and adolescence: Secondary | ICD-10-CM

## 2022-06-08 MED ORDER — AMPHETAMINE-DEXTROAMPHETAMINE 10 MG PO TABS: ORAL_TABLET | ORAL | 0 refills | Status: DC

## 2022-07-13 ENCOUNTER — Other Ambulatory Visit: Payer: Self-pay | Admitting: Family Medicine

## 2022-07-13 DIAGNOSIS — F988 Other specified behavioral and emotional disorders with onset usually occurring in childhood and adolescence: Secondary | ICD-10-CM

## 2022-07-14 NOTE — Telephone Encounter (Signed)
Requested medications are due for refill today.  yes  Requested medications are on the active medications list.  yes  Last refill. 06/08/2022 #30 0 rf  Future visit scheduled.   yes  Notes to clinic.  Refill not delegated.    Requested Prescriptions  Pending Prescriptions Disp Refills   amphetamine-dextroamphetamine (ADDERALL) 10 MG tablet [Pharmacy Med Name: AMPHETAMINE SALTS 10MG  TAB(GEN ADDERALL)] 30 tablet 0    Sig: TAKE ONE TABLET BY MOUTH EVERY MORNING WITH BREAKFAST     Not Delegated - Psychiatry:  Stimulants/ADHD Failed - 07/13/2022 12:13 PM      Failed - This refill cannot be delegated      Failed - Urine Drug Screen completed in last 360 days      Passed - Last BP in normal range    BP Readings from Last 1 Encounters:  04/27/22 112/76         Passed - Last Heart Rate in normal range    Pulse Readings from Last 1 Encounters:  04/27/22 92         Passed - Valid encounter within last 6 months    Recent Outpatient Visits           2 months ago Diabetes mellitus without complication (HCC)   [place] [facility] Simmons-Robinson, [place], MD   5 months ago Encounter for annual wellness exam in Medicare patient   [place] [facility] [place], [place], MD   10 months ago Diabetes mellitus without complication [facility])   [place] [facility] Bosie Clos, MD   1 year ago Viral URI   [facility] Bosie Clos, MD   1 year ago Annual physical exam   [place] [facility] Bosie Clos, MD       Future Appointments             In 1 week Simmons-Robinson, Tawanna Cooler, MD [facility]

## 2022-07-27 ENCOUNTER — Ambulatory Visit: Payer: Medicare PPO | Admitting: Family Medicine

## 2022-07-27 LAB — HM DIABETES EYE EXAM

## 2022-08-10 ENCOUNTER — Ambulatory Visit: Payer: Medicare PPO | Admitting: Family Medicine

## 2022-08-10 ENCOUNTER — Encounter: Payer: Self-pay | Admitting: Family Medicine

## 2022-08-10 VITALS — BP 125/80 | HR 92 | Resp 15 | Ht 67.0 in | Wt 200.7 lb

## 2022-08-10 DIAGNOSIS — M1A9XX0 Chronic gout, unspecified, without tophus (tophi): Secondary | ICD-10-CM

## 2022-08-10 DIAGNOSIS — E7800 Pure hypercholesterolemia, unspecified: Secondary | ICD-10-CM | POA: Diagnosis not present

## 2022-08-10 DIAGNOSIS — F988 Other specified behavioral and emotional disorders with onset usually occurring in childhood and adolescence: Secondary | ICD-10-CM | POA: Diagnosis not present

## 2022-08-10 DIAGNOSIS — E119 Type 2 diabetes mellitus without complications: Secondary | ICD-10-CM

## 2022-08-10 DIAGNOSIS — I10 Essential (primary) hypertension: Secondary | ICD-10-CM

## 2022-08-10 NOTE — Assessment & Plan Note (Signed)
Chronic Not yet at goal of less than 7  Repeated A1c ordered  Continue Farxiga 10mg  daily Continue 500mg  metformin daily Follow up in 3 months  Continue  simvastatin 20mg  daily  UTD on eye exam recently and foot exam

## 2022-08-10 NOTE — Assessment & Plan Note (Signed)
Chronic  Symptoms are stable  Continue adderall 10mg  daily

## 2022-08-10 NOTE — Progress Notes (Signed)
I,Vanessa  Vital,acting as a Neurosurgeon for [employer], MD.,have documented all relevant documentation on the behalf of Ronnald Ramp, MD,as directed by  Ronnald Ramp, MD while in the presence of Ronnald Ramp, MD.   Established patient visit   Patient: Antonio Whitney   DOB: 1955-02-18   67 y.o. Male  MRN: [id number] Visit Date: 08/10/2022  Today's healthcare provider: Ronnald Ramp, MD   Chief Complaint  Patient presents with   Diabetes   Hypertension   Subjective      Hypertension, follow-up  BP Readings from Last 3 Encounters:  08/10/22 125/80  04/27/22 112/76  01/23/22 131/72   Wt Readings from Last 3 Encounters:  08/10/22 200 lb 11.2 oz (91 kg)  04/27/22 196 lb 1.6 oz (89 kg)  01/23/22 196 lb (88.9 kg)     He was last seen for hypertension 3 months ago.  BP at that visit was 112/76. Management since that visit includes no changes.  He reports excellent compliance with treatment. He is not having side effects.  He is following a Regular diet. He is exercising. He does not smoke.  Outside blood pressures are 118/75 Symptoms: No chest pain No chest pressure  No palpitations No syncope  No dyspnea No orthopnea  No paroxysmal nocturnal dyspnea No lower extremity edema   Pertinent labs Lab Results  Component Value Date   CHOL 160 01/27/2022   HDL 74 01/27/2022   LDLCALC 70 01/27/2022   TRIG 89 01/27/2022   CHOLHDL 2.2 01/27/2022   Lab Results  Component Value Date   NA 144 01/27/2022   K 4.4 01/27/2022   CREATININE 1.08 01/27/2022   EGFR 76 01/27/2022   GLUCOSE 156 (H) 01/27/2022   TSH 1.350 12/09/2020     The 10-year ASCVD risk score (Arnett DK, et al., 2019) is: 21.4%  --------------------------------------------------------------------------------------------------- Diabetes Mellitus Type II, Follow-up  Lab Results  Component Value Date   HGBA1C 7.8 (A) 04/27/2022   HGBA1C 8.1 (H)  01/27/2022   HGBA1C 7.5 (A) 04/09/2021   Wt Readings from Last 3 Encounters:  08/10/22 200 lb 11.2 oz (91 kg)  04/27/22 196 lb 1.6 oz (89 kg)  01/23/22 196 lb (88.9 kg)   Last seen for diabetes 3 months ago.  Management since then includes no changes. He reports excellent compliance with treatment. He is not having side effects.  Symptoms: No fatigue No foot ulcerations  No appetite changes No nausea  No paresthesia of the feet  No polydipsia  No polyuria No visual disturbances   No vomiting     Home blood sugar records:  this morning, 201  Episodes of hypoglycemia? No   Current insulin regiment: none  Most Recent Eye Exam: 2 weeks ago Current exercise: bicycling and walking Current diet habits: in general, a "healthy" diet    Pertinent Labs: Lab Results  Component Value Date   CHOL 160 01/27/2022   HDL 74 01/27/2022   LDLCALC 70 01/27/2022   TRIG 89 01/27/2022   CHOLHDL 2.2 01/27/2022   Lab Results  Component Value Date   NA 144 01/27/2022   K 4.4 01/27/2022   CREATININE 1.08 01/27/2022   EGFR 76 01/27/2022   MICRALBCREAT 5 01/27/2022     --------------------------------------------------------------------------------------------------- Chronic Sleep Apnea Patient presents with hx of obstructive sleep apnea. Patent reports using two CPAP machines, one that stays at home and one that he takes while traveling  Resmed 10 auto titration, one at hm   Gout: Patient presents for  chronic gout follow up. He reports taking allopurinol 300mg  daily. Last gout attack was several years ago. Symptoms have been stable on allopurinol.    Medications: Outpatient Medications Prior to Visit  Medication Sig   Accu-Chek Softclix Lancets lancets Use as instructed   allopurinol (ZYLOPRIM) 300 MG tablet TAKE 1 TABLET(300 MG) BY MOUTH DAILY   amLODipine-benazepril (LOTREL) 5-20 MG capsule Take 1 capsule by mouth daily.   amphetamine-dextroamphetamine (ADDERALL) 10 MG tablet TAKE  ONE TABLET BY MOUTH EVERY MORNING WITH BREAKFAST   blood glucose meter kit and supplies Dispense based on patient and insurance preference. Check blood sugars every morning before breakfast. (FOR ICD-10 E11.9).   Cholecalciferol (VITAMIN D) 50 MCG (2000 UT) CAPS Take by mouth.   dapagliflozin propanediol (FARXIGA) 10 MG TABS tablet Take 1 tablet (10 mg total) by mouth daily with breakfast.   finasteride (PROSCAR) 5 MG tablet TAKE ONE-HALF TABLET BY MOUTH EVERY OTHER DAY   glucose blood (ACCU-CHEK GUIDE) test strip CHECK BLOOD SUGARS EVERY MORNING BEFORE BREAKFAST   Inulin (METAMUCIL CLEAR & NATURAL) POWD Take by mouth.   metFORMIN (GLUCOPHAGE) 500 MG tablet TAKE ONE TABLET BY MOUTH ONE TIME DAILY WITH BREAKFAST   Multiple Vitamins-Minerals (CENTRUM SILVER 50+MEN PO) daily.   Omega-3 Fatty Acids (FISH OIL PO) Take 1,400 mg by mouth.   simvastatin (ZOCOR) 20 MG tablet TAKE 1 TABLET(20 MG) BY MOUTH AT BEDTIME   Zinc 30 MG TABS Take by mouth.   NON FORMULARY CPAP at night   No facility-administered medications prior to visit.    Review of Systems  All other systems reviewed and are negative.      Objective    BP 125/80 (BP Location: Left Arm, Patient Position: Sitting, Cuff Size: Normal)   Pulse 92   Resp 15   Ht 5\' 7"  (1.702 m)   Wt 200 lb 11.2 oz (91 kg)   SpO2 97%   BMI 31.43 kg/m    Physical Exam Vitals reviewed.  Constitutional:      General: He is not in acute distress.    Appearance: Normal appearance. He is not ill-appearing, toxic-appearing or diaphoretic.  Eyes:     Conjunctiva/sclera: Conjunctivae normal.  Cardiovascular:     Rate and Rhythm: Normal rate and regular rhythm.     Pulses: Normal pulses.     Heart sounds: Normal heart sounds. No murmur heard.    No friction rub. No gallop.  Pulmonary:     Effort: Pulmonary effort is normal. No respiratory distress.     Breath sounds: Normal breath sounds. No stridor. No wheezing, rhonchi or rales.  Abdominal:      General: Bowel sounds are normal. There is no distension.     Palpations: Abdomen is soft.     Tenderness: There is no abdominal tenderness.  Musculoskeletal:     Right lower leg: No edema.     Left lower leg: No edema.  Skin:    Findings: No erythema or rash.  Neurological:     Mental Status: He is alert and oriented to person, place, and time.       No results found for any visits on 08/10/22.  Assessment & Plan     Problem List Items Addressed This Visit     ADD (attention deficit disorder) - Primary    Chronic  Symptoms are stable  Continue adderall 10mg  daily        Relevant Orders   Comprehensive metabolic panel   Essential (primary) hypertension  Controlled BP at goal Continue amlodipine-benazepril 5-20mg  daily  CMP ordered today        Relevant Orders   Comprehensive metabolic panel   Gout    Chronic  Stable  No acute flare at this time nor recently  Continue allopurinol 300mg  daily  Will obtain uric acid levels        Relevant Orders   Uric acid   Hypercholesteremia    Chronic  Stable  Continue simvastatin 20mg  daily  Lipid panel ordered today  Encouraged him to continue dietary/lifestyle management for lipid management        Relevant Orders   Lipid Profile   Diabetes mellitus without complication (HCC)    Chronic Not yet at goal of less than 7  Repeated A1c ordered  Continue Farxiga 10mg  daily Continue 500mg  metformin daily Follow up in 3 months  Continue  simvastatin 20mg  daily  UTD on eye exam recently and foot exam         Relevant Orders   Hemoglobin A1c     Return in about 3 months (around 11/10/2022) for CPE.       The entirety of the information documented in the History of Present Illness, Review of Systems and Physical Exam were personally obtained by me. Portions of this information were initially documented by Lubertha Basque, CMA. I, Ronnald Ramp, MD have reviewed the documentation above for  thoroughness and accuracy.     Ronnald Ramp, MD  [facility] [phone] (phone) [phone] (fax)  [facility]

## 2022-08-10 NOTE — Assessment & Plan Note (Signed)
Chronic  Stable  No acute flare at this time nor recently  Continue allopurinol 300mg  daily  Will obtain uric acid levels

## 2022-08-10 NOTE — Assessment & Plan Note (Addendum)
Chronic  Stable  Continue simvastatin 20mg  daily  Lipid panel ordered today  Encouraged him to continue dietary/lifestyle management for lipid management

## 2022-08-10 NOTE — Assessment & Plan Note (Signed)
Controlled BP at goal Continue amlodipine-benazepril 5-20mg  daily  CMP ordered today

## 2022-08-18 LAB — HEMOGLOBIN A1C
Est. average glucose Bld gHb Est-mCnc: 209 mg/dL
Hgb A1c MFr Bld: 8.9 % — ABNORMAL HIGH (ref 4.8–5.6)

## 2022-08-18 LAB — COMPREHENSIVE METABOLIC PANEL
ALT: 31 IU/L (ref 0–44)
AST: 21 IU/L (ref 0–40)
Albumin: 4.7 g/dL (ref 3.9–4.9)
Alkaline Phosphatase: 83 IU/L (ref 44–121)
BUN/Creatinine Ratio: 20 (ref 10–24)
BUN: 23 mg/dL (ref 8–27)
Bilirubin Total: 0.6 mg/dL (ref 0.0–1.2)
CO2: 22 mmol/L (ref 20–29)
Calcium: 10.5 mg/dL — ABNORMAL HIGH (ref 8.6–10.2)
Chloride: 99 mmol/L (ref 96–106)
Creatinine, Ser: 1.17 mg/dL (ref 0.76–1.27)
Globulin, Total: 2.8 g/dL (ref 1.5–4.5)
Glucose: 233 mg/dL — ABNORMAL HIGH (ref 70–99)
Potassium: 4.7 mmol/L (ref 3.5–5.2)
Sodium: 137 mmol/L (ref 134–144)
Total Protein: 7.5 g/dL (ref 6.0–8.5)
eGFR: 68 mL/min/{1.73_m2} (ref >59)

## 2022-08-18 LAB — LIPID PANEL
Chol/HDL Ratio: 2.6 ratio (ref 0.0–5.0)
Cholesterol, Total: 217 mg/dL — ABNORMAL HIGH (ref 100–199)
HDL: 84 mg/dL (ref >39)
LDL Chol Calc (NIH): 109 mg/dL — ABNORMAL HIGH (ref 0–99)
Triglycerides: 139 mg/dL (ref 0–149)
VLDL Cholesterol Cal: 24 mg/dL (ref 5–40)

## 2022-08-18 LAB — URIC ACID: Uric Acid: 4.7 mg/dL (ref 3.8–8.4)

## 2022-08-20 ENCOUNTER — Telehealth: Payer: Self-pay | Admitting: Family Medicine

## 2022-08-20 ENCOUNTER — Other Ambulatory Visit: Payer: Self-pay | Admitting: Family Medicine

## 2022-08-20 DIAGNOSIS — F988 Other specified behavioral and emotional disorders with onset usually occurring in childhood and adolescence: Secondary | ICD-10-CM

## 2022-08-20 NOTE — Telephone Encounter (Signed)
Medication Refill - Medication: amphetamine-dextroamphetamine (ADDERALL) 10 MG tablet   Has the patient contacted their pharmacy? Yes.     Preferred Pharmacy (with phone number or street name):  [facility] [address] - [place], [place] - [address] Phone: [phone]  Fax: [phone]      Has the patient been seen for an appointment in the last year OR does the patient have an upcoming appointment? Yes.    Please assist patient further

## 2022-08-20 NOTE — Telephone Encounter (Signed)
Pt is calling in requesting the status of his refill for amphetamine-dextroamphetamine (ADDERALL) 10 MG tablet [[id number]] . Pt says [facility] pharmacy has faxed over a few requests and haven't heard anything back. Please advise.

## 2022-08-21 ENCOUNTER — Other Ambulatory Visit: Payer: Self-pay

## 2022-08-21 DIAGNOSIS — F988 Other specified behavioral and emotional disorders with onset usually occurring in childhood and adolescence: Secondary | ICD-10-CM

## 2022-08-21 MED ORDER — AMPHETAMINE-DEXTROAMPHETAMINE 10 MG PO TABS: ORAL_TABLET | ORAL | 0 refills | Status: DC

## 2022-08-21 NOTE — Telephone Encounter (Signed)
Patient is completely out and has been for a few days

## 2022-08-21 NOTE — Telephone Encounter (Signed)
Medication ordered and sent to [id number] 08/21/22

## 2022-08-21 NOTE — Telephone Encounter (Signed)
Requested medication (s) are due for refill today:no  Requested medication (s) are on the active medication list:no  Last refill:  08/21/22  Future visit scheduled: yes  Notes to clinic:  med not delegated to NT to RF-    Requested Prescriptions  Pending Prescriptions Disp Refills   amphetamine-dextroamphetamine (ADDERALL) 10 MG tablet 30 tablet 0    Sig: TAKE ONE TABLET BY MOUTH EVERY MORNING WITH BREAKFAST     Not Delegated - Psychiatry:  Stimulants/ADHD Failed - 08/20/2022  5:44 PM      Failed - This refill cannot be delegated      Failed - Urine Drug Screen completed in last 360 days      Passed - Last BP in normal range    BP Readings from Last 1 Encounters:  08/10/22 125/80         Passed - Last Heart Rate in normal range    Pulse Readings from Last 1 Encounters:  08/10/22 92         Passed - Valid encounter within last 6 months    Recent Outpatient Visits           1 week ago Attention deficit disorder, unspecified hyperactivity presence   [place] [facility] Simmons-Robinson, [place], MD   3 months ago Diabetes mellitus without complication (HCC)   [place] [facility] Simmons-Robinson, [place], MD   7 months ago Encounter for annual wellness exam in Medicare patient   [place] [facility] [place], [place], MD   1 year ago Diabetes mellitus without complication [facility])   [place] [facility] Bosie Clos, MD   1 year ago Viral URI   [place] [facility] Bosie Clos, MD       Future Appointments             In 2 months Simmons-Robinson, Tawanna Cooler, MD [facility]

## 2022-09-21 ENCOUNTER — Other Ambulatory Visit: Payer: Self-pay | Admitting: Family Medicine

## 2022-09-21 DIAGNOSIS — F988 Other specified behavioral and emotional disorders with onset usually occurring in childhood and adolescence: Secondary | ICD-10-CM

## 2022-09-21 MED ORDER — AMPHETAMINE-DEXTROAMPHETAMINE 10 MG PO TABS: ORAL_TABLET | ORAL | 0 refills | Status: DC

## 2022-09-21 NOTE — Telephone Encounter (Signed)
[facility] faxed refill request for the following medications:   amphetamine-dextroamphetamine (ADDERALL) 10 MG tablet     Please advise.

## 2022-10-25 ENCOUNTER — Other Ambulatory Visit: Payer: Self-pay | Admitting: Family Medicine

## 2022-10-25 DIAGNOSIS — F988 Other specified behavioral and emotional disorders with onset usually occurring in childhood and adolescence: Secondary | ICD-10-CM

## 2022-11-10 ENCOUNTER — Ambulatory Visit: Payer: Medicare PPO | Admitting: Family Medicine

## 2022-11-10 ENCOUNTER — Encounter: Payer: Self-pay | Admitting: Family Medicine

## 2022-11-10 VITALS — BP 116/79 | HR 93 | Temp 97.7°F | Ht 67.0 in | Wt 198.0 lb

## 2022-11-10 DIAGNOSIS — E7800 Pure hypercholesterolemia, unspecified: Secondary | ICD-10-CM

## 2022-11-10 DIAGNOSIS — F900 Attention-deficit hyperactivity disorder, predominantly inattentive type: Secondary | ICD-10-CM | POA: Diagnosis not present

## 2022-11-10 DIAGNOSIS — R41840 Attention and concentration deficit: Secondary | ICD-10-CM

## 2022-11-10 DIAGNOSIS — M1A9XX0 Chronic gout, unspecified, without tophus (tophi): Secondary | ICD-10-CM | POA: Diagnosis not present

## 2022-11-10 DIAGNOSIS — Z79899 Other long term (current) drug therapy: Secondary | ICD-10-CM | POA: Diagnosis not present

## 2022-11-10 DIAGNOSIS — E559 Vitamin D deficiency, unspecified: Secondary | ICD-10-CM | POA: Diagnosis not present

## 2022-11-10 DIAGNOSIS — Z0000 Encounter for general adult medical examination without abnormal findings: Secondary | ICD-10-CM | POA: Insufficient documentation

## 2022-11-10 DIAGNOSIS — G4739 Other sleep apnea: Secondary | ICD-10-CM | POA: Diagnosis not present

## 2022-11-10 DIAGNOSIS — I10 Essential (primary) hypertension: Secondary | ICD-10-CM

## 2022-11-10 DIAGNOSIS — E1165 Type 2 diabetes mellitus with hyperglycemia: Secondary | ICD-10-CM | POA: Diagnosis not present

## 2022-11-10 DIAGNOSIS — Z1211 Encounter for screening for malignant neoplasm of colon: Secondary | ICD-10-CM

## 2022-11-10 DIAGNOSIS — Z7984 Long term (current) use of oral hypoglycemic drugs: Secondary | ICD-10-CM

## 2022-11-10 MED ORDER — METFORMIN HCL 1000 MG PO TABS
1000.0000 mg | ORAL_TABLET | Freq: Two times a day (BID) | ORAL | 3 refills | Status: DC
Start: 2022-11-10 — End: 2023-08-17

## 2022-11-10 MED ORDER — AMPHETAMINE-DEXTROAMPHETAMINE 10 MG PO TABS
ORAL_TABLET | ORAL | 0 refills | Status: DC
Start: 2022-11-23 — End: 2023-01-29

## 2022-11-10 MED ORDER — AMPHETAMINE-DEXTROAMPHETAMINE 10 MG PO TABS: ORAL_TABLET | ORAL | 0 refills | Status: DC

## 2022-11-10 NOTE — Assessment & Plan Note (Signed)
  On Vitamin D 2000 international units daily. chronic,stable -Continue Vitamin D 2000 international units daily.

## 2022-11-10 NOTE — Assessment & Plan Note (Signed)
On Simvastatin 20mg  daily. chronic -Continue Simvastatin 20mg  daily. -Check lipid panel in December.

## 2022-11-10 NOTE — Assessment & Plan Note (Signed)
Well controlled on Adderall 10mg  daily. chronic,stable -Continue Adderall 10mg  daily with 90-day supply.

## 2022-11-10 NOTE — Assessment & Plan Note (Signed)
Controlled BP at goal Continue amlodipine-benazepril 5-20mg  daily  No medications changes today

## 2022-11-10 NOTE — Assessment & Plan Note (Signed)
Well controlled on Allopurinol 300mg  daily. Chronic No recent flares -Continue Allopurinol 300mg  daily.

## 2022-11-10 NOTE — Progress Notes (Signed)
Established patient visit   Patient: Antonio Whitney   DOB: 05-Dec-1955   67 y.o. Male  MRN: [id number] Visit Date: 11/10/2022  Today's healthcare provider: Ronnald Ramp, MD   Chief Complaint  Patient presents with   Diabetes    Patient was last seen about 3 months ago.  His glucose is checked at home and has been ranging 160-206 with 1 reading as high as 224   ADHD   Subjective     HPI     Diabetes    Additional comments: Patient was last seen about 3 months ago.  His glucose is checked at home and has been ranging 160-206 with 1 reading as high as 224      Last edited by Adline Peals, CMA on 11/10/2022  8:12 AM.       Discussed the use of AI scribe software for clinical note transcription with the patient, who gave verbal consent to proceed.  History of Present Illness   Antonio Whitney, a 67 year old patient with a history of ADHD, gout, vitamin D deficiency, diabetes, and hyperlipidemia, presents for a routine follow-up. The patient reports being consistent with his current medication regimen, which includes Adderall for ADHD, allopurinol for gout, vitamin D supplements, Farxiga for diabetes, and simvastatin for hyperlipidemia.  The patient expresses frustration with the refill process for Adderall, describing it as a "pain in the butt." He also mentions a recent increase in blood glucose levels despite no changes in diet or exercise habits. The patient is proactive in managing his diabetes, regularly exercising, and monitoring his blood glucose levels.  The patient also reports using a CPAP machine nightly for sleep apnea and is considering a different mask due to mouth breathing. He has a ResMed 11 and a mini one for travel, both of which he uses consistently.  The patient acknowledges being overdue for a colonoscopy and expresses a need to schedule one, as his previous doctor has retired. He also mentions receiving a flu shot and a COVID-19 booster at a  local pharmacy, which he wants to be documented in his medical records.  The patient is also considering whether to continue taking a multivitamin, as he maintains a balanced diet. He expresses a willingness to stop taking the multivitamin and monitor for any deficiencies.  Lastly, the patient mentions a broken leg that was previously fixed with titanium. He reports that the foot on the same side feels a bit different, but he does not believe it is related to diabetes and still has sensation everywhere.      [facility]  [place] [facility] 10/25/22 [id number] I'm 0.37ml Flu trival   [employer]    [phone]   [facility] 07/27/22, reports normsal scan    Past Medical History:  Diagnosis Date   Attention deficit disorder    Diabetes (HCC)    Gout    Hyperlipidemia    Hypertension     Medications: Outpatient Medications Prior to Visit  Medication Sig   Accu-Chek Softclix Lancets lancets Use as instructed   allopurinol (ZYLOPRIM) 300 MG tablet TAKE 1 TABLET(300 MG) BY MOUTH DAILY   amLODipine-benazepril (LOTREL) 5-20 MG capsule Take 1 capsule by mouth daily.   blood glucose meter kit and supplies Dispense based on patient and insurance preference. Check blood sugars every morning before breakfast. (FOR ICD-10 E11.9).   Cholecalciferol (VITAMIN D) 50 MCG (2000 UT) CAPS Take by mouth.   dapagliflozin propanediol (FARXIGA) 10 MG TABS tablet Take 1 tablet (  10 mg total) by mouth daily with breakfast.   finasteride (PROSCAR) 5 MG tablet TAKE ONE-HALF TABLET BY MOUTH EVERY OTHER DAY   glucose blood (ACCU-CHEK GUIDE) test strip CHECK BLOOD SUGARS EVERY MORNING BEFORE BREAKFAST   Inulin (METAMUCIL CLEAR & NATURAL) POWD Take by mouth.   NON FORMULARY CPAP at night   Omega-3 Fatty Acids (FISH OIL PO) Take 1,400 mg by mouth.   simvastatin (ZOCOR) 20 MG tablet TAKE 1 TABLET(20 MG) BY MOUTH AT BEDTIME   Zinc 30 MG TABS Take by mouth.   [DISCONTINUED] amphetamine-dextroamphetamine (ADDERALL)  10 MG tablet TAKE ONE TABLET BY MOUTH EVERY MORNING WITH BREAKFAST   [DISCONTINUED] metFORMIN (GLUCOPHAGE) 500 MG tablet TAKE ONE TABLET BY MOUTH ONE TIME DAILY WITH BREAKFAST   [DISCONTINUED] Multiple Vitamins-Minerals (CENTRUM SILVER 50+MEN PO) daily.   No facility-administered medications prior to visit.    Review of Systems  Last metabolic panel Lab Results  Component Value Date   GLUCOSE 233 (H) 08/17/2022   NA 137 08/17/2022   K 4.7 08/17/2022   CL 99 08/17/2022   CO2 22 08/17/2022   BUN 23 08/17/2022   CREATININE 1.17 08/17/2022   EGFR 68 08/17/2022   CALCIUM 10.5 (H) 08/17/2022   PROT 7.5 08/17/2022   ALBUMIN 4.7 08/17/2022   LABGLOB 2.8 08/17/2022   AGRATIO 1.7 01/27/2022   BILITOT 0.6 08/17/2022   ALKPHOS 83 08/17/2022   AST 21 08/17/2022   ALT 31 08/17/2022   ANIONGAP 14 08/05/2021   Last hemoglobin A1c Lab Results  Component Value Date   HGBA1C 8.9 (H) 08/17/2022        Objective    BP 116/79 (BP Location: Right Arm, Patient Position: Sitting, Cuff Size: Normal)   Pulse 93   Temp 97.7 F (36.5 C) (Oral)   Ht 5\' 7"  (1.702 m)   Wt 198 lb (89.8 kg)   SpO2 96%   BMI 31.01 kg/m     Physical Exam Vitals reviewed.  Constitutional:      General: He is not in acute distress.    Appearance: Normal appearance. He is not ill-appearing, toxic-appearing or diaphoretic.  Eyes:     Conjunctiva/sclera: Conjunctivae normal.  Cardiovascular:     Rate and Rhythm: Normal rate and regular rhythm.     Pulses: Normal pulses.     Heart sounds: Normal heart sounds. No murmur heard.    No friction rub. No gallop.  Pulmonary:     Effort: Pulmonary effort is normal. No respiratory distress.     Breath sounds: Normal breath sounds. No stridor. No wheezing, rhonchi or rales.  Abdominal:     General: Bowel sounds are normal. There is no distension.     Palpations: Abdomen is soft.     Tenderness: There is no abdominal tenderness.  Musculoskeletal:     Right lower  leg: No edema.     Left lower leg: Edema present.     Comments: Mild edema in LLE without pitting  Skin:    Findings: No erythema or rash.  Neurological:     Mental Status: He is alert and oriented to person, place, and time.       No results found for any visits on 11/10/22.  Assessment & Plan     Problem List Items Addressed This Visit     ADD (attention deficit disorder)    Well controlled on Adderall 10mg  daily. chronic,stable -Continue Adderall 10mg  daily with 90-day supply.      Relevant Medications  amphetamine-dextroamphetamine (ADDERALL) 10 MG tablet (Start on 11/23/2022)   amphetamine-dextroamphetamine (ADDERALL) 10 MG tablet (Start on 12/24/2022)   amphetamine-dextroamphetamine (ADDERALL) 10 MG tablet (Start on 01/21/2023)   Apnea, sleep    Chronic  Stable with CPAP use  Patient advised to continue CPAP nightly  The patient has had significant benefit from the use of CPAP machine with considerable improvements in quality of life, work production and decrease medical complaints.  The patient will need continued maintenance and care CPAP supplies (including upgrades as deemed appropriate) in order to continue treatment for sleep apnea as this is medically necessary.         Avitaminosis D     On Vitamin D 2000 international units daily. chronic,stable -Continue Vitamin D 2000 international units daily.      Essential (primary) hypertension - Primary    Controlled BP at goal Continue amlodipine-benazepril 5-20mg  daily  No medications changes today         Gout    Well controlled on Allopurinol 300mg  daily. Chronic No recent flares -Continue Allopurinol 300mg  daily.      Hypercholesteremia    On Simvastatin 20mg  daily. chronic -Continue Simvastatin 20mg  daily. -Check lipid panel in December.      Type 2 diabetes mellitus with hyperglycemia (HCC)    Recent increase in blood glucose levels. Currently on Farxiga 10mg  daily and Metformin 500mg   daily. chronic, not yet at goal -Increase Metformin to 500mg  twice daily for a week, then 1000mg  in the morning for a week, and if tolerated, increase to 1000mg  twice daily. -Continue Farxiga 10mg  daily. -Check A1C and CMP in December.      Relevant Medications   metFORMIN (GLUCOPHAGE) 1000 MG tablet   Other Visit Diagnoses     On stimulant medication       Screening for colon cancer       Relevant Orders   Ambulatory referral to Gastroenterology              General Health Maintenance / Followup Plans -Physical exam scheduled for December -Placed referral for colonoscopy.   Return in about 2 months (around 01/25/2023) for AWV, labs.         Ronnald Ramp, MD  [facility] [phone] (phone) [phone] (fax)  [facility]

## 2022-11-10 NOTE — Assessment & Plan Note (Signed)
Recent increase in blood glucose levels. Currently on Farxiga 10mg  daily and Metformin 500mg  daily. chronic, not yet at goal -Increase Metformin to 500mg  twice daily for a week, then 1000mg  in the morning for a week, and if tolerated, increase to 1000mg  twice daily. -Continue Farxiga 10mg  daily. -Check A1C and CMP in December.

## 2022-11-10 NOTE — Assessment & Plan Note (Signed)
Chronic  Stable with CPAP use  Patient advised to continue CPAP nightly  The patient has had significant benefit from the use of CPAP machine with considerable improvements in quality of life, work production and decrease medical complaints.  The patient will need continued maintenance and care CPAP supplies (including upgrades as deemed appropriate) in order to continue treatment for sleep apnea as this is medically necessary.     

## 2022-11-10 NOTE — Patient Instructions (Addendum)
VISIT SUMMARY:  Dear Antonio Whitney, thank you for coming in for your routine follow-up. We discussed your ADHD, gout, vitamin D deficiency, diabetes, and hyperlipidemia. You've been doing a great job managing these conditions with your current medications. We also talked about your concerns with the Adderall refill process, your recent increase in blood glucose levels, and your use of a CPAP machine for sleep apnea. You mentioned that you're overdue for a colonoscopy and that you've received a flu shot and a COVID-19 booster. Lastly, we discussed your multivitamin use and your foot sensation.  YOUR PLAN:  -ADHD: Your ADHD is well controlled with Adderall. We will continue your current dose and provide a 90-day supply.  -GOUT: Your gout is well managed with Allopurinol. We will continue your current dose.  -VITAMIN D DEFICIENCY: You're doing well with your Vitamin D supplement. We will continue your current dose.  -TYPE 2 DIABETES MELLITUS: Despite a recent increase in blood glucose levels, you're managing your diabetes well. We will increase your Metformin dose and continue [place]. We'll also check your A1C and CMP in December.  -HYPERLIPIDEMIA: Your hyperlipidemia is well controlled with Simvastatin. We will continue your current dose and check your lipid panel in December.  -COLONOSCOPY: We will place a referral for you to get this done.  -GENERAL HEALTH MAINTENANCE: We've scheduled a physical exam for December. We'll also check your uric acid levels then. Continue using your CPAP machine for sleep apnea, and consider a different mask if needed. We'll decide on your multivitamin use based on your diet and upcoming blood work results.  INSTRUCTIONS:  Please continue taking your medications as advised. We've made some changes to your Metformin dose, so please follow the new instructions. We'll check your A1C, CMP, lipid panel, and uric acid levels in December. We've also placed a referral for  your colonoscopy. Please continue using your CPAP machine and consider a different mask if needed. We'll discuss your multivitamin use at your next appointment.

## 2022-11-11 ENCOUNTER — Telehealth: Payer: Self-pay

## 2022-11-11 ENCOUNTER — Other Ambulatory Visit: Payer: Self-pay

## 2022-11-11 DIAGNOSIS — Z860100 Personal history of colon polyps, unspecified: Secondary | ICD-10-CM

## 2022-11-11 MED ORDER — NA SULFATE-K SULFATE-MG SULF 17.5-3.13-1.6 GM/177ML PO SOLN
1.0000 | Freq: Once | ORAL | 0 refills | Status: AC
Start: 2022-11-11 — End: 2022-11-11

## 2022-11-11 NOTE — Telephone Encounter (Signed)
Pt calling to schedule his screening colonoscopy. I have advised pt to come to the office to fill out a medical release to get his previous colonoscopy reports.

## 2022-11-11 NOTE — Telephone Encounter (Signed)
Gastroenterology Pre-Procedure Review  Request Date: 11/18/22 Requesting Physician: Dr. Allegra Lai  PATIENT REVIEW QUESTIONS: The patient responded to the following health history questions as indicated:    1. Are you having any GI issues? no 2. Do you have a personal history of Polyps? yes (LAST COLONOSCOPY WAS 03/02/16 WITH DR. MEODOFF RECOMMENDED REPEAT IN 5 YEARS) 3. Do you have a family history of Colon Cancer or Polyps? no 4. Diabetes Mellitus? yes (HAS BEEN ADVISED TO STOP METFORMIN 2 DAYS BEFORE COLONOSCOPY STOP FARXIGA 3 DAYS BEFORE) 5. Joint replacements in the past 12 months?no 6. Major health problems in the past 3 months?no 7. Any artificial heart valves, MVP, or defibrillator?no    MEDICATIONS & ALLERGIES:    Patient reports the following regarding taking any anticoagulation/antiplatelet therapy:   Plavix, Coumadin, Eliquis, Xarelto, Lovenox, Pradaxa, Brilinta, or Effient? no Aspirin? yes (81 MG DAILY)  Patient confirms/reports the following medications:  Current Outpatient Medications  Medication Sig Dispense Refill   aspirin EC 81 MG tablet Take by mouth.     LYCOPENE PO daily.     Accu-Chek Softclix Lancets lancets Use as instructed 100 each 12   allopurinol (ZYLOPRIM) 300 MG tablet TAKE 1 TABLET(300 MG) BY MOUTH DAILY 90 tablet 3   amLODipine-benazepril (LOTREL) 5-20 MG capsule Take 1 capsule by mouth daily. 90 capsule 1   [START ON 11/23/2022] amphetamine-dextroamphetamine (ADDERALL) 10 MG tablet TAKE ONE TABLET BY MOUTH EVERY MORNING WITH BREAKFAST 30 tablet 0   [START ON 12/24/2022] amphetamine-dextroamphetamine (ADDERALL) 10 MG tablet TAKE ONE TABLET BY MOUTH EVERY MORNING WITH BREAKFAST 30 tablet 0   [START ON 01/21/2023] amphetamine-dextroamphetamine (ADDERALL) 10 MG tablet TAKE ONE TABLET BY MOUTH EVERY MORNING WITH BREAKFAST 30 tablet 0   blood glucose meter kit and supplies Dispense based on patient and insurance preference. Check blood sugars every morning before  breakfast. (FOR ICD-10 E11.9). 1 each 0   Cholecalciferol (VITAMIN D) 50 MCG (2000 UT) CAPS Take by mouth.     dapagliflozin propanediol (FARXIGA) 10 MG TABS tablet Take 1 tablet (10 mg total) by mouth daily with breakfast. 90 tablet 3   finasteride (PROSCAR) 5 MG tablet TAKE ONE-HALF TABLET BY MOUTH EVERY OTHER DAY 30 tablet 11   glucose blood (ACCU-CHEK GUIDE) test strip CHECK BLOOD SUGARS EVERY MORNING BEFORE BREAKFAST 100 strip 11   Inulin (METAMUCIL CLEAR & NATURAL) POWD Take by mouth.     metFORMIN (GLUCOPHAGE) 1000 MG tablet Take 1 tablet (1,000 mg total) by mouth 2 (two) times daily with a meal. 180 tablet 3   NON FORMULARY CPAP at night     Omega-3 Fatty Acids (FISH OIL PO) Take 1,400 mg by mouth.     simvastatin (ZOCOR) 20 MG tablet TAKE 1 TABLET(20 MG) BY MOUTH AT BEDTIME 90 tablet 3   Zinc 30 MG TABS Take by mouth.     No current facility-administered medications for this visit.    Patient confirms/reports the following allergies:  No Known Allergies  No orders of the defined types were placed in this encounter.   AUTHORIZATION INFORMATION Primary Insurance: 1D#: Group #:  Secondary Insurance: 1D#: Group #:  SCHEDULE INFORMATION: Date: 11/18/22 Time: Location: [facility]

## 2022-11-18 ENCOUNTER — Ambulatory Visit
Admission: RE | Admit: 2022-11-18 | Discharge: 2022-11-18 | Disposition: A | Discharge status: Home / Self Care | Payer: Medicare PPO | Attending: Gastroenterology | Admitting: Gastroenterology

## 2022-11-18 ENCOUNTER — Ambulatory Visit: Payer: Medicare PPO | Admitting: Anesthesiology

## 2022-11-18 ENCOUNTER — Encounter
Admission: RE | Disposition: A | Discharge status: Home / Self Care | Payer: Self-pay | Source: Home / Self Care | Attending: Gastroenterology

## 2022-11-18 ENCOUNTER — Other Ambulatory Visit: Payer: Self-pay

## 2022-11-18 ENCOUNTER — Encounter: Payer: Self-pay | Admitting: Gastroenterology

## 2022-11-18 DIAGNOSIS — K644 Residual hemorrhoidal skin tags: Secondary | ICD-10-CM | POA: Diagnosis not present

## 2022-11-18 DIAGNOSIS — E119 Type 2 diabetes mellitus without complications: Secondary | ICD-10-CM | POA: Insufficient documentation

## 2022-11-18 DIAGNOSIS — G4730 Sleep apnea, unspecified: Secondary | ICD-10-CM | POA: Diagnosis not present

## 2022-11-18 DIAGNOSIS — Z860100 Personal history of colon polyps, unspecified: Secondary | ICD-10-CM | POA: Diagnosis not present

## 2022-11-18 DIAGNOSIS — K5730 Diverticulosis of large intestine without perforation or abscess without bleeding: Secondary | ICD-10-CM | POA: Diagnosis not present

## 2022-11-18 DIAGNOSIS — D120 Benign neoplasm of cecum: Secondary | ICD-10-CM | POA: Insufficient documentation

## 2022-11-18 DIAGNOSIS — E785 Hyperlipidemia, unspecified: Secondary | ICD-10-CM | POA: Insufficient documentation

## 2022-11-18 DIAGNOSIS — Z1211 Encounter for screening for malignant neoplasm of colon: Secondary | ICD-10-CM | POA: Diagnosis not present

## 2022-11-18 DIAGNOSIS — D124 Benign neoplasm of descending colon: Secondary | ICD-10-CM | POA: Diagnosis not present

## 2022-11-18 DIAGNOSIS — K635 Polyp of colon: Secondary | ICD-10-CM

## 2022-11-18 DIAGNOSIS — Z7984 Long term (current) use of oral hypoglycemic drugs: Secondary | ICD-10-CM | POA: Insufficient documentation

## 2022-11-18 DIAGNOSIS — Z79899 Other long term (current) drug therapy: Secondary | ICD-10-CM | POA: Diagnosis not present

## 2022-11-18 DIAGNOSIS — I10 Essential (primary) hypertension: Secondary | ICD-10-CM | POA: Diagnosis not present

## 2022-11-18 HISTORY — PX: POLYPECTOMY: SHX5525

## 2022-11-18 HISTORY — PX: COLONOSCOPY WITH PROPOFOL: SHX5780

## 2022-11-18 LAB — GLUCOSE, CAPILLARY: Glucose-Capillary: 206 mg/dL — ABNORMAL HIGH (ref 70–99)

## 2022-11-18 SURGERY — COLONOSCOPY WITH PROPOFOL
Anesthesia: General

## 2022-11-18 MED ORDER — SODIUM CHLORIDE 0.9 % IV SOLN: INTRAVENOUS | Status: DC

## 2022-11-18 MED ORDER — PROPOFOL 1000 MG/100ML IV EMUL
INTRAVENOUS | Status: AC
  Filled 2022-11-18: qty 100

## 2022-11-18 MED ORDER — LIDOCAINE HCL (CARDIAC) PF 100 MG/5ML IV SOSY
PREFILLED_SYRINGE | INTRAVENOUS | Status: DC | PRN
  Administered 2022-11-18: 100 mg via INTRAVENOUS

## 2022-11-18 MED ORDER — PROPOFOL 10 MG/ML IV BOLUS
INTRAVENOUS | Status: DC | PRN
  Administered 2022-11-18: 70 mg via INTRAVENOUS

## 2022-11-18 MED ORDER — PROPOFOL 500 MG/50ML IV EMUL
INTRAVENOUS | Status: DC | PRN
  Administered 2022-11-18: 165 ug/kg/min via INTRAVENOUS

## 2022-11-18 NOTE — Anesthesia Postprocedure Evaluation (Signed)
Anesthesia Post Note  Patient: Antonio Whitney  Procedure(s) Performed: COLONOSCOPY WITH PROPOFOL POLYPECTOMY  Patient location during evaluation: Endoscopy Anesthesia Type: General Level of consciousness: awake and alert Pain management: pain level controlled Vital Signs Assessment: post-procedure vital signs reviewed and stable Respiratory status: spontaneous breathing, nonlabored ventilation, respiratory function stable and patient connected to nasal cannula oxygen Cardiovascular status: blood pressure returned to baseline and stable Postop Assessment: no apparent nausea or vomiting Anesthetic complications: no   No notable events documented.   Last Vitals:  Vitals:   11/18/22 0942 11/18/22 0952  BP: 110/75 112/72  Pulse: 69 61  Resp: 18 17  Temp:    SpO2: 99% 97%    Last Pain:  Vitals:   11/18/22 0952  TempSrc:   PainSc: 0-No pain                 Lenard Simmer

## 2022-11-18 NOTE — Transfer of Care (Signed)
Immediate Anesthesia Transfer of Care Note  Patient: Antonio Whitney  Procedure(s) Performed: Procedure(s): COLONOSCOPY WITH PROPOFOL (N/A) POLYPECTOMY  Patient Location: PACU and Endoscopy Unit  Anesthesia Type:General  Level of Consciousness: sedated  Airway & Oxygen Therapy: Patient Spontanous Breathing and Patient connected to nasal cannula oxygen  Post-op Assessment: Report given to RN and Post -op Vital signs reviewed and stable  Post vital signs: Reviewed and stable  Last Vitals:  Vitals:   11/18/22 0754 11/18/22 0932  BP: 124/80 96/61  Pulse: 82 64  Resp: 20 16  Temp: (!) 36.2 C   SpO2: 98% 92%    Complications: No apparent anesthesia complications

## 2022-11-18 NOTE — Op Note (Signed)
[facility] Gastroenterology Patient Name: Antonio Whitney Procedure Date: 11/18/2022 8:57 AM MRN: [id number] Account #: [id number] Date of Birth: 06-09-1955 Admit Type: Outpatient Age: 67 Room: [facility] ENDO ROOM 4 Gender: Male Note Status: Finalized Instrument Name: Prentice Docker [id number] Procedure:             Colonoscopy Indications:           Surveillance: Personal history of adenomatous polyps                         on last colonoscopy > 5 years ago Providers:             Toney Reil MD, MD Medicines:             General Anesthesia Complications:         No immediate complications. Estimated blood loss: None. Procedure:             Pre-Anesthesia Assessment:                        - Prior to the procedure, a History and Physical was                         performed, and patient medications and allergies were                         reviewed. The patient is competent. The risks and                         benefits of the procedure and the sedation options and                         risks were discussed with the patient. All questions                         were answered and informed consent was obtained.                         Patient identification and proposed procedure were                         verified by the physician, the nurse, the                         anesthesiologist, the anesthetist and the technician                         in the pre-procedure area in the procedure room in the                         endoscopy suite. Mental Status Examination: alert and                         oriented. Airway Examination: normal oropharyngeal                         airway and neck mobility. Respiratory Examination:                         clear  to auscultation. CV Examination: normal.                         Prophylactic Antibiotics: The patient does not require                         prophylactic antibiotics. Prior Anticoagulants: The                          patient has taken no anticoagulant or antiplatelet                         agents. ASA Grade Assessment: III - A patient with                         severe systemic disease. After reviewing the risks and                         benefits, the patient was deemed in satisfactory                         condition to undergo the procedure. The anesthesia                         plan was to use general anesthesia. Immediately prior                         to administration of medications, the patient was                         re-assessed for adequacy to receive sedatives. The                         heart rate, respiratory rate, oxygen saturations,                         blood pressure, adequacy of pulmonary ventilation, and                         response to care were monitored throughout the                         procedure. The physical status of the patient was                         re-assessed after the procedure.                        After obtaining informed consent, the colonoscope was                         passed under direct vision. Throughout the procedure,                         the patient's blood pressure, pulse, and oxygen                         saturations were monitored continuously. The  Colonoscope was introduced through the anus and                         advanced to the the cecum, identified by appendiceal                         orifice and ileocecal valve. The colonoscopy was                         performed without difficulty. The patient tolerated                         the procedure well. The quality of the bowel                         preparation was evaluated using the BBPS [facility] Bowel                         Preparation Scale) with scores of: Right Colon = 3,                         Transverse Colon = 3 and Left Colon = 3 (entire mucosa                         seen well with no residual staining, small fragments                          of stool or opaque liquid). The total BBPS score                         equals 9. The ileocecal valve, appendiceal orifice,                         and rectum were photographed. Findings:      The perianal and digital rectal examinations were normal. Pertinent       negatives include normal sphincter tone and no palpable rectal lesions.      Two sessile polyps were found in the cecum. The polyps were 4 to 5 mm in       size. These polyps were removed with a cold snare. Resection and       retrieval were complete.      A 4 mm polyp was found in the descending colon. The polyp was sessile.       The polyp was removed with a cold snare. Resection and retrieval were       complete.      A diminutive polyp was found in the descending colon. The polyp was       sessile. The polyp was removed with a jumbo cold forceps. Resection and       retrieval were complete.      Multiple large-mouthed diverticula were found in the recto-sigmoid colon       and sigmoid colon.      Non-bleeding external hemorrhoids were found during retroflexion. The       hemorrhoids were medium-sized. Impression:            - Two 4 to 5 mm polyps in the cecum, removed with a  cold snare. Resected and retrieved.                        - One 4 mm polyp in the descending colon, removed with                         a cold snare. Resected and retrieved.                        - One diminutive polyp in the descending colon,                         removed with a jumbo cold forceps. Resected and                         retrieved.                        - Diverticulosis in the recto-sigmoid colon and in the                         sigmoid colon.                        - Non-bleeding external hemorrhoids. Recommendation:        - Discharge patient to home (with escort).                        - Resume previous diet today.                        - Continue present medications.                         - Await pathology results.                        - Repeat colonoscopy in 3 - 5 years for surveillance                         based on pathology results. Procedure Code(s):     --- Professional ---                        [phone], Colonoscopy, flexible; with removal of                         tumor(s), polyp(s), or other lesion(s) by snare                         technique                        45380, 59, Colonoscopy, flexible; with biopsy, single                         or multiple Diagnosis Code(s):     --- Professional ---                        Z86.010, Personal history of colonic polyps  D12.0, Benign neoplasm of cecum                        D12.4, Benign neoplasm of descending colon                        K64.4, Residual hemorrhoidal skin tags                        K57.30, Diverticulosis of large intestine without                         perforation or abscess without bleeding CPT copyright 2022 American Medical Association. All rights reserved. The codes documented in this report are preliminary and upon coder review may  be revised to meet current compliance requirements. Dr. Libby Maw Toney Reil MD, MD 11/18/2022 9:28:58 AM This report has been signed electronically. Number of Addenda: 0 Note Initiated On: 11/18/2022 8:57 AM Scope Withdrawal Time: 0 hours 9 minutes 50 seconds  Total Procedure Duration: 0 hours 13 minutes 21 seconds  Estimated Blood Loss:  Estimated blood loss: none.      [facility]

## 2022-11-18 NOTE — H&P (Signed)
Arlyss Repress, MD [address]  [address]  [place], [place] [address]  Main: [phone]  Fax: [phone] Pager: [phone]  Primary Care Physician:  Ronnald Ramp, MD Primary Gastroenterologist:  Dr. Arlyss Repress  Pre-Procedure History & Physical: HPI:  Antonio Whitney is a 67 y.o. male is here for an colonoscopy.   Past Medical History:  Diagnosis Date   Attention deficit disorder    Diabetes (HCC)    Gout    Hyperlipidemia    Hypertension     Past Surgical History:  Procedure Laterality Date   APPENDECTOMY     HERNIA REPAIR     inguinal left-1980   SYNDESMOSIS REPAIR Left 08/05/2021   Procedure: SYNDESMOTIC STABILIZATION OF MAISONNEUVE FRACTURE, LEFT ANKLE;  Surgeon: Christena Flake, MD;  Location: [facility] ORS;  Service: Orthopedics;  Laterality: Left;    Prior to Admission medications   Medication Sig Start Date End Date Taking? Authorizing Provider  allopurinol (ZYLOPRIM) 300 MG tablet TAKE 1 TABLET(300 MG) BY MOUTH DAILY 04/27/22  Yes Simmons-Robinson, Makiera, MD  amLODipine-benazepril (LOTREL) 5-20 MG capsule Take 1 capsule by mouth daily. 04/27/22  Yes Simmons-Robinson, Makiera, MD  amphetamine-dextroamphetamine (ADDERALL) 10 MG tablet TAKE ONE TABLET BY MOUTH EVERY MORNING WITH BREAKFAST 11/23/22  Yes Simmons-Robinson, Makiera, MD  Cholecalciferol (VITAMIN D) 50 MCG (2000 UT) CAPS Take by mouth.   Yes [provider]  finasteride (PROSCAR) 5 MG tablet TAKE ONE-HALF TABLET BY MOUTH EVERY OTHER DAY 04/27/22  Yes Simmons-Robinson, Makiera, MD  Omega-3 Fatty Acids (FISH OIL PO) Take 1,400 mg by mouth. 04/21/10  Yes [provider]  simvastatin (ZOCOR) 20 MG tablet TAKE 1 TABLET(20 MG) BY MOUTH AT BEDTIME 04/27/22  Yes Simmons-Robinson, Makiera, MD  Zinc 30 MG TABS Take by mouth.   Yes [provider]  Accu-Chek Softclix Lancets lancets Use as instructed 04/27/22   Simmons-Robinson, Tawanna Cooler, MD  amphetamine-dextroamphetamine  (ADDERALL) 10 MG tablet TAKE ONE TABLET BY MOUTH EVERY MORNING WITH BREAKFAST 12/24/22   Simmons-Robinson, Tawanna Cooler, MD  amphetamine-dextroamphetamine (ADDERALL) 10 MG tablet TAKE ONE TABLET BY MOUTH EVERY MORNING WITH BREAKFAST 01/21/23   Simmons-Robinson, Makiera, MD  aspirin EC 81 MG tablet Take by mouth. Patient not taking: Reported on 11/18/2022 03/21/16   [provider]  blood glucose meter kit and supplies Dispense based on patient and insurance preference. Check blood sugars every morning before breakfast. (FOR ICD-10 E11.9). 04/10/20   Bosie Clos, MD  dapagliflozin propanediol (FARXIGA) 10 MG TABS tablet Take 1 tablet (10 mg total) by mouth daily with breakfast. 04/27/22   Simmons-Robinson, Makiera, MD  glucose blood (ACCU-CHEK GUIDE) test strip CHECK BLOOD SUGARS EVERY MORNING BEFORE BREAKFAST 04/27/22   Simmons-Robinson, Tawanna Cooler, MD  Inulin (METAMUCIL CLEAR & NATURAL) POWD Take by mouth. 04/21/10   [provider]  LYCOPENE PO daily. Patient not taking: Reported on 11/18/2022 03/21/16   [provider]  metFORMIN (GLUCOPHAGE) 1000 MG tablet Take 1 tablet (1,000 mg total) by mouth 2 (two) times daily with a meal. 11/10/22   Simmons-Robinson, Tawanna Cooler, MD  NON FORMULARY CPAP at night    [provider]    Allergies as of 11/12/2022   (No Known Allergies)    Family History  Problem Relation Age of Onset   Breast cancer Mother    Lung cancer Father     Social History   Socioeconomic History   Marital status: Married    Spouse name: [name]   Number of children: 2  Years of education: 64   Highest education level: Bachelor's degree (e.g., BA, AB, BS)  Occupational History   Occupation: Art gallery manager now  Tobacco Use   Smoking status: Never   Smokeless tobacco: Never  Vaping Use   Vaping status: Never Used  Substance and Sexual Activity   Alcohol use: No   Drug use: No   Sexual activity: Yes  Other Topics Concern   Not on file   Social History Narrative   Not on file   Social Determinants of Health   Financial Resource Strain: Low Risk  (08/07/2022)   Overall Financial Resource Strain (CARDIA)    Difficulty of Paying Living Expenses: Not hard at all  Food Insecurity: No Food Insecurity (08/07/2022)   Hunger Vital Sign    Worried About Running Out of Food in the Last Year: Never true    Ran Out of Food in the Last Year: Never true  Transportation Needs: No Transportation Needs (08/07/2022)   PRAPARE - Administrator, Civil Service (Medical): No    Lack of Transportation (Non-Medical): No  Physical Activity: Sufficiently Active (08/07/2022)   Exercise Vital Sign    Days of Exercise per Week: 7 days    Minutes of Exercise per Session: 60 min  Stress: No Stress Concern Present (08/07/2022)   [employer] of Occupational Health - Occupational Stress Questionnaire    Feeling of Stress : Not at all  Social Connections: Socially Isolated (08/07/2022)   Social Connection and Isolation Panel [NHANES]    Frequency of Communication with Friends and Family: Once a week    Frequency of Social Gatherings with Friends and Family: Once a week    Attends Religious Services: Never    Database administrator or Organizations: No    Attends Engineer, structural: Not on file    Marital Status: Married  Catering manager Violence: Not on file    Review of Systems: See HPI, otherwise negative ROS  Physical Exam: BP 124/80   Pulse 82   Temp (!) 97.2 F (36.2 C) (Temporal)   Resp 20   Ht 5\' 7"  (1.702 m)   Wt 89.1 kg   SpO2 98%   BMI 30.76 kg/m  General:   Alert,  pleasant and cooperative in NAD Head:  Normocephalic and atraumatic. Neck:  Supple; no masses or thyromegaly. Lungs:  Clear throughout to auscultation.    Heart:  Regular rate and rhythm. Abdomen:  Soft, nontender and nondistended. Normal bowel sounds, without guarding, and without rebound.   Neurologic:  Alert and  oriented x4;   grossly normal neurologically.  Impression/Plan: Antonio Whitney is here for an colonoscopy to be performed for h/o colon polyps  Risks, benefits, limitations, and alternatives regarding  colonoscopy have been reviewed with the patient.  Questions have been answered.  All parties agreeable.   Lannette Donath, MD  11/18/2022, 8:38 AM

## 2022-11-18 NOTE — Anesthesia Preprocedure Evaluation (Signed)
Anesthesia Evaluation  Patient identified by MRN, date of birth, ID band Patient awake    Reviewed: Allergy & Precautions, NPO status , Patient's Chart, lab work & pertinent test results  History of Anesthesia Complications Negative for: history of anesthetic complications  Airway Mallampati: III  TM Distance: <3 FB Neck ROM: full    Dental  (+) Chipped, Dental Advidsory Given   Pulmonary neg shortness of breath, sleep apnea and Continuous Positive Airway Pressure Ventilation , neg recent URI   Pulmonary exam normal        Cardiovascular Exercise Tolerance: Good hypertension, (-) angina (-) Past MI and (-) DOE Normal cardiovascular exam     Neuro/Psych  PSYCHIATRIC DISORDERS      negative neurological ROS     GI/Hepatic negative GI ROS, Neg liver ROS,neg GERD  ,,  Endo/Other  diabetes, Type 2    Renal/GU      Musculoskeletal   Abdominal   Peds  Hematology negative hematology ROS (+)   Anesthesia Other Findings Past Medical History: No date: Attention deficit disorder No date: Diabetes (HCC) No date: Gout No date: Hyperlipidemia No date: Hypertension  Past Surgical History: No date: APPENDECTOMY No date: HERNIA REPAIR     Comment:  inguinal left-1980  BMI    Body Mass Index: 30.77 kg/m      Reproductive/Obstetrics negative OB ROS                             Anesthesia Physical Anesthesia Plan  ASA: 3  Anesthesia Plan: General   Post-op Pain Management: Regional block*   Induction: Intravenous  PONV Risk Score and Plan: 2 and Treatment may vary due to age or medical condition, Propofol infusion and TIVA  Airway Management Planned: Natural Airway and Nasal Cannula  Additional Equipment:   Intra-op Plan:   Post-operative Plan:   Informed Consent:      Dental Advisory Given  Plan Discussed with: Anesthesiologist, CRNA and Surgeon  Anesthesia Plan Comments:  (Patient consented for risks of anesthesia including but not limited to:  - adverse reactions to medications - damage to eyes, teeth, lips or other oral mucosa - nerve damage due to positioning  - sore throat or hoarseness - Damage to heart, brain, nerves, lungs, other parts of body or loss of life  Patient voiced understanding.)        Anesthesia Quick Evaluation

## 2022-11-19 ENCOUNTER — Encounter: Payer: Self-pay | Admitting: Gastroenterology

## 2022-11-19 LAB — SURGICAL PATHOLOGY

## 2022-11-22 ENCOUNTER — Encounter: Payer: Self-pay | Admitting: Gastroenterology

## 2022-11-23 ENCOUNTER — Other Ambulatory Visit: Payer: Self-pay | Admitting: Family Medicine

## 2022-11-23 NOTE — Progress Notes (Signed)
90 day supply of ADHD medications sent to pharmacy

## 2023-01-27 ENCOUNTER — Ambulatory Visit: Payer: Medicare PPO | Admitting: Family Medicine

## 2023-01-27 ENCOUNTER — Encounter: Payer: Self-pay | Admitting: Family Medicine

## 2023-01-27 VITALS — BP 118/76 | HR 88 | Resp 18 | Ht 67.0 in | Wt 195.4 lb

## 2023-01-27 DIAGNOSIS — F900 Attention-deficit hyperactivity disorder, predominantly inattentive type: Secondary | ICD-10-CM

## 2023-01-27 DIAGNOSIS — Z0000 Encounter for general adult medical examination without abnormal findings: Secondary | ICD-10-CM | POA: Insufficient documentation

## 2023-01-27 DIAGNOSIS — E559 Vitamin D deficiency, unspecified: Secondary | ICD-10-CM | POA: Diagnosis not present

## 2023-01-27 DIAGNOSIS — Z0001 Encounter for general adult medical examination with abnormal findings: Secondary | ICD-10-CM | POA: Diagnosis not present

## 2023-01-27 DIAGNOSIS — M1A9XX0 Chronic gout, unspecified, without tophus (tophi): Secondary | ICD-10-CM

## 2023-01-27 DIAGNOSIS — R41840 Attention and concentration deficit: Secondary | ICD-10-CM

## 2023-01-27 DIAGNOSIS — Z6830 Body mass index (BMI) 30.0-30.9, adult: Secondary | ICD-10-CM

## 2023-01-27 DIAGNOSIS — Z7984 Long term (current) use of oral hypoglycemic drugs: Secondary | ICD-10-CM

## 2023-01-27 DIAGNOSIS — I10 Essential (primary) hypertension: Secondary | ICD-10-CM | POA: Diagnosis not present

## 2023-01-27 DIAGNOSIS — E7800 Pure hypercholesterolemia, unspecified: Secondary | ICD-10-CM

## 2023-01-27 DIAGNOSIS — G4739 Other sleep apnea: Secondary | ICD-10-CM

## 2023-01-27 DIAGNOSIS — M109 Gout, unspecified: Secondary | ICD-10-CM

## 2023-01-27 DIAGNOSIS — E1165 Type 2 diabetes mellitus with hyperglycemia: Secondary | ICD-10-CM | POA: Diagnosis not present

## 2023-01-27 DIAGNOSIS — E119 Type 2 diabetes mellitus without complications: Secondary | ICD-10-CM

## 2023-01-27 MED ORDER — DAPAGLIFLOZIN PROPANEDIOL 10 MG PO TABS
10.0000 mg | ORAL_TABLET | Freq: Every day | ORAL | 2 refills | Status: DC
Start: 2023-01-27 — End: 2023-04-29

## 2023-01-27 MED ORDER — AMPHETAMINE-DEXTROAMPHETAMINE 10 MG PO TABS
ORAL_TABLET | ORAL | 0 refills | Status: DC
Start: 2023-03-29 — End: 2023-04-29

## 2023-01-27 MED ORDER — AMPHETAMINE-DEXTROAMPHETAMINE 10 MG PO TABS: ORAL_TABLET | ORAL | 0 refills | Status: DC

## 2023-01-27 MED ORDER — ALLOPURINOL 300 MG PO TABS: ORAL_TABLET | ORAL | 2 refills | Status: DC

## 2023-01-27 MED ORDER — AMLODIPINE BESY-BENAZEPRIL HCL 5-20 MG PO CAPS
1.0000 | ORAL_CAPSULE | Freq: Every day | ORAL | 2 refills | Status: DC
Start: 2023-01-27 — End: 2023-12-17

## 2023-01-27 NOTE — Assessment & Plan Note (Signed)
Concerned about diabetes management. Currently on metformin 1000 mg twice daily, with recent increase in dosage. Reports good tolerance to the medication. Blood pressure is well-controlled. BMI is 30, indicating obesity. Discussed the importance of weight loss for diabetes management and overall health. - Order HbA1c test - ordered urine albumin  - Encourage weight loss to improve diabetes management - Continue current medications including metformin and Marcelline Deist - Review results at next visit in March

## 2023-01-27 NOTE — Patient Instructions (Addendum)
It was a pleasure to see you today!  Thank you for choosing [facility] for your primary care.   Today you were seen for your annual physical and annual wellness visit (medicare)   Please review the attached information regarding helpful preventive health topics.   We will follow up with results of labs once they are available.    To keep you healthy, please keep in mind the following health maintenance items that you are due for:   1.Shingrix vaccine  Please make sure to schedule your next annual physical for one year from today.   Best Wishes,   Dr. Roxan Hockey

## 2023-01-27 NOTE — Assessment & Plan Note (Signed)
Annual wellness visit completed today including all of the following: -Reviewed patient's family medical history -Reviewed and updated patient's list of medical providers -Completed assessment of cognitive impairment -Completed assessment of patient's functional ability -Provided patient with recommendations for health screening services as well as vaccines Health risk assessment completed and reviewed -Discussed recommendations for well-balanced diet in addition to 150 minutes of physical activity per week  

## 2023-01-27 NOTE — Assessment & Plan Note (Signed)
Chronic  Stable  Continue adderall 10mg  daily  Refills sent for 3 months  Follow up in 3 months for controlled substance management PDMP reviewed and accurate

## 2023-01-27 NOTE — Assessment & Plan Note (Signed)
67 year old presenting for an annual physical visit.   Reports a recent decrease in exercise due to vertigo, which has improved. Completed a comprehensive questionnaire online. - Perform comprehensive physical exam - Review and update medications - Order lab tests including cholesterol, metabolic panel, uric acid, and vitamin D - Performed foot exam - Review eye exam records from [facility]

## 2023-01-27 NOTE — Progress Notes (Signed)
Complete physical exam   Patient: Antonio Whitney   DOB: 05-Feb-1956   67 y.o. Male  MRN: [id number] Visit Date: 01/27/2023  Today's healthcare provider: Ronnald Ramp, MD   Chief Complaint  Patient presents with   Annual Exam   Medicare Wellness   Subjective    Antonio Whitney is a 67 y.o. male who presents today for a complete physical exam.   He reports consuming a general diet.    Walks two miles per day weather permitting, usually spins 30 mins 5 days per week but has not been able to do so due to vertigo symptoms     He generally feels well.   He reports sleeping well.    He does not have additional problems to discuss today.   Discussed the use of AI scribe software for clinical note transcription with the patient, who gave verbal consent to proceed.  History of Present Illness   The patient, a 67 year old with a history of diabetes, hypertension, and hyperlipidemia, presents for an annual wellness visit. He reports a generally healthy diet, consisting of hard-boiled eggs and [place] sausage for breakfast, and grilled salmon or chicken for dinner. He admits to occasional snacking at night.  Regarding exercise, the patient usually walks two miles daily and spins for 30 minutes five days a week. However, he reports a recent decrease in exercise due to an episode of vertigo experienced after a trip to [place] in October. The vertigo was most severe upon waking up in the morning, causing a sensation of the room spinning and prompting concerns about falling off the exercise bike. The vertigo has since improved, with the patient reporting no noticeable symptoms on the day of the visit.  The patient is also concerned about his diabetes management. He has been taking Metformin 1000mg  twice daily since mid-November, after using up the remaining supply of the previous 500mg  dose. He hopes this will lead to further improvement in his A1C levels.  The patient also  reports a history of a broken leg about a year and a half ago, which required surgery and titanium implants. He notes a slight persistent numbness in the area but no significant issues.  The patient is up-to-date with his eye exams, having had the last one in June. He also reports receiving the COVID-19 and flu vaccines in September. He expresses interest in discussing the shingles vaccine at his next visit in March.       COVID Spivax 10/25/22-2025 at [facility]- called to confirm vaccination status and LOT number for documentation purposes  Eye exam 07/27/22 at [facility]  Past Medical History:  Diagnosis Date   Attention deficit disorder    Diabetes (HCC)    Diverticulosis 03/25/2016   Gout    Hyperlipidemia    Hypertension    Sleep apnea    CPAP   Past Surgical History:  Procedure Laterality Date   APPENDECTOMY     COLONOSCOPY WITH PROPOFOL N/A 11/18/2022   Procedure: COLONOSCOPY WITH PROPOFOL;  Surgeon: Toney Reil, MD;  Location: [facility] ENDOSCOPY;  Service: Gastroenterology;  Laterality: N/A;   FRACTURE SURGERY  08/05/2021   HERNIA REPAIR     inguinal left-1980   POLYPECTOMY  11/18/2022   Procedure: POLYPECTOMY;  Surgeon: Toney Reil, MD;  Location: [facility] ENDOSCOPY;  Service: Gastroenterology;;   SYNDESMOSIS REPAIR Left 08/05/2021   Procedure: SYNDESMOTIC STABILIZATION OF MAISONNEUVE FRACTURE, LEFT ANKLE;  Surgeon: Christena Flake, MD;  Location: [facility] ORS;  Service: Orthopedics;  Laterality: Left;   Social History   Socioeconomic History   Marital status: Married    Spouse name: [name]   Number of children: 2   Years of education: 16   Highest education level: Bachelor's degree (e.g., BA, AB, BS)  Occupational History   Occupation: Art gallery manager now  Tobacco Use   Smoking status: Never   Smokeless tobacco: Never  Vaping Use   Vaping status: Never Used  Substance and Sexual Activity   Alcohol use: No   Drug use: No   Sexual activity: Yes     Birth control/protection: Surgical  Other Topics Concern   Not on file  Social History Narrative   Not on file   Social Drivers of Health   Financial Resource Strain: Low Risk  (08/07/2022)   Overall Financial Resource Strain (CARDIA)    Difficulty of Paying Living Expenses: Not hard at all  Food Insecurity: No Food Insecurity (08/07/2022)   Hunger Vital Sign    Worried About Running Out of Food in the Last Year: Never true    Ran Out of Food in the Last Year: Never true  Transportation Needs: No Transportation Needs (08/07/2022)   PRAPARE - Administrator, Civil Service (Medical): No    Lack of Transportation (Non-Medical): No  Physical Activity: Sufficiently Active (08/07/2022)   Exercise Vital Sign    Days of Exercise per Week: 7 days    Minutes of Exercise per Session: 60 min  Stress: No Stress Concern Present (08/07/2022)   [employer] of Occupational Health - Occupational Stress Questionnaire    Feeling of Stress : Not at all  Social Connections: Socially Isolated (08/07/2022)   Social Connection and Isolation Panel [NHANES]    Frequency of Communication with Friends and Family: Once a week    Frequency of Social Gatherings with Friends and Family: Once a week    Attends Religious Services: Never    Database administrator or Organizations: No    Attends Engineer, structural: Not on file    Marital Status: Married  Catering manager Violence: Not on file   Family Status  Relation Name Status   Mother  Deceased at age 31   Father  Deceased at age 67       laryngeal cancer   Brother  Alive  No partnership data on file   Family History  Problem Relation Age of Onset   Breast cancer Mother    Lung cancer Father    No Known Allergies   Medications: Outpatient Medications Prior to Visit  Medication Sig   Accu-Chek Softclix Lancets lancets Use as instructed   amphetamine-dextroamphetamine (ADDERALL) 10 MG tablet TAKE ONE TABLET BY MOUTH  EVERY MORNING WITH BREAKFAST   amphetamine-dextroamphetamine (ADDERALL) 10 MG tablet TAKE ONE TABLET BY MOUTH EVERY MORNING WITH BREAKFAST   blood glucose meter kit and supplies Dispense based on patient and insurance preference. Check blood sugars every morning before breakfast. (FOR ICD-10 E11.9).   Cholecalciferol (VITAMIN D) 50 MCG (2000 UT) CAPS Take by mouth.   finasteride (PROSCAR) 5 MG tablet TAKE ONE-HALF TABLET BY MOUTH EVERY OTHER DAY   glucose blood (ACCU-CHEK GUIDE) test strip CHECK BLOOD SUGARS EVERY MORNING BEFORE BREAKFAST   Inulin (METAMUCIL CLEAR & NATURAL) POWD Take by mouth.   metFORMIN (GLUCOPHAGE) 1000 MG tablet Take 1 tablet (1,000 mg total) by mouth 2 (two) times daily with a meal.   NON FORMULARY CPAP at night   Omega-3  Fatty Acids (FISH OIL PO) Take 1,400 mg by mouth.   simvastatin (ZOCOR) 20 MG tablet TAKE 1 TABLET(20 MG) BY MOUTH AT BEDTIME   Zinc 30 MG TABS Take by mouth.   [DISCONTINUED] allopurinol (ZYLOPRIM) 300 MG tablet TAKE 1 TABLET(300 MG) BY MOUTH DAILY   [DISCONTINUED] amLODipine-benazepril (LOTREL) 5-20 MG capsule Take 1 capsule by mouth daily.   [DISCONTINUED] amphetamine-dextroamphetamine (ADDERALL) 10 MG tablet TAKE ONE TABLET BY MOUTH EVERY MORNING WITH BREAKFAST   [DISCONTINUED] dapagliflozin propanediol (FARXIGA) 10 MG TABS tablet Take 1 tablet (10 mg total) by mouth daily with breakfast.   [DISCONTINUED] aspirin EC 81 MG tablet Take by mouth. (Patient not taking: Reported on 11/18/2022)   [DISCONTINUED] LYCOPENE PO daily. (Patient not taking: Reported on 11/18/2022)   No facility-administered medications prior to visit.    Review of Systems  Last CBC Lab Results  Component Value Date   WBC 7.5 08/05/2021   HGB 14.0 08/05/2021   HCT 42.0 08/05/2021   MCV 90.7 08/05/2021   MCH 30.2 08/05/2021   RDW 13.0 08/05/2021   PLT 262 08/05/2021   Last metabolic panel Lab Results  Component Value Date   GLUCOSE 233 (H) 08/17/2022   NA 137  08/17/2022   K 4.7 08/17/2022   CL 99 08/17/2022   CO2 22 08/17/2022   BUN 23 08/17/2022   CREATININE 1.17 08/17/2022   EGFR 68 08/17/2022   CALCIUM 10.5 (H) 08/17/2022   PROT 7.5 08/17/2022   ALBUMIN 4.7 08/17/2022   LABGLOB 2.8 08/17/2022   AGRATIO 1.7 01/27/2022   BILITOT 0.6 08/17/2022   ALKPHOS 83 08/17/2022   AST 21 08/17/2022   ALT 31 08/17/2022   ANIONGAP 14 08/05/2021   Last lipids Lab Results  Component Value Date   CHOL 217 (H) 08/17/2022   HDL 84 08/17/2022   LDLCALC 109 (H) 08/17/2022   TRIG 139 08/17/2022   CHOLHDL 2.6 08/17/2022   Last hemoglobin A1c Lab Results  Component Value Date   HGBA1C 8.9 (H) 08/17/2022   Last thyroid functions Lab Results  Component Value Date   TSH 1.350 12/09/2020       Objective    BP 118/76   Pulse 88   Resp 18   Ht 5\' 7"  (1.702 m)   Wt 195 lb 6.4 oz (88.6 kg)   SpO2 97%   BMI 30.60 kg/m  BP Readings from Last 3 Encounters:  01/27/23 118/76  11/18/22 112/72  11/10/22 116/79   Wt Readings from Last 3 Encounters:  01/27/23 195 lb 6.4 oz (88.6 kg)  11/18/22 196 lb 6.4 oz (89.1 kg)  11/10/22 198 lb (89.8 kg)        Physical Exam Vitals reviewed.  Constitutional:      General: He is not in acute distress.    Appearance: Normal appearance. He is not ill-appearing, toxic-appearing or diaphoretic.  HENT:     Head: Normocephalic and atraumatic.     Right Ear: Tympanic membrane and external ear normal.     Left Ear: Tympanic membrane and external ear normal.     Nose: Nose normal.     Mouth/Throat:     Mouth: Mucous membranes are moist.     Pharynx: No oropharyngeal exudate or posterior oropharyngeal erythema.  Eyes:     General: No scleral icterus.    Extraocular Movements: Extraocular movements intact.     Conjunctiva/sclera: Conjunctivae normal.     Pupils: Pupils are equal, round, and reactive to light.  Neck:  Vascular: No carotid bruit.  Cardiovascular:     Rate and Rhythm: Normal rate and  regular rhythm.     Pulses: Normal pulses.          Dorsalis pedis pulses are 2+ on the right side and 2+ on the left side.       Posterior tibial pulses are 2+ on the right side and 2+ on the left side.     Heart sounds: Normal heart sounds. No murmur heard.    No friction rub. No gallop.  Pulmonary:     Effort: Pulmonary effort is normal. No respiratory distress.     Breath sounds: Normal breath sounds. No stridor. No wheezing, rhonchi or rales.  Abdominal:     General: Bowel sounds are normal. There is no distension.     Palpations: Abdomen is soft.     Tenderness: There is no abdominal tenderness.  Musculoskeletal:        General: No swelling, tenderness or signs of injury. Normal range of motion.     Cervical back: Normal range of motion and neck supple. No rigidity or tenderness.     Right lower leg: No edema.     Left lower leg: No edema.     Right foot: Normal range of motion. No deformity or bunion.     Left foot: Normal range of motion. No deformity or bunion.  Feet:     Right foot:     Protective Sensation: 6 sites tested.  6 sites sensed.     Skin integrity: Skin integrity normal. No erythema.     Toenail Condition: Right toenails are normal.     Left foot:     Protective Sensation: 6 sites tested.  6 sites sensed.     Skin integrity: Skin integrity normal. No erythema.     Toenail Condition: Left toenails are normal.  Lymphadenopathy:     Cervical: No cervical adenopathy.  Skin:    General: Skin is warm and dry.     Capillary Refill: Capillary refill takes less than 2 seconds.     Findings: No erythema or rash.  Neurological:     General: No focal deficit present.     Mental Status: He is alert and oriented to person, place, and time.     Cranial Nerves: Cranial nerves 2-12 are intact.     Sensory: Sensation is intact.     Motor: Motor function is intact. No weakness, tremor or abnormal muscle tone.     Gait: Gait normal.  Psychiatric:        Attention and  Perception: Attention normal.        Mood and Affect: Mood normal.        Speech: Speech normal.        Behavior: Behavior normal. Behavior is cooperative.        Thought Content: Thought content normal.       Last depression screening scores    01/27/2023    8:30 AM 11/10/2022    8:14 AM 01/23/2022    2:37 PM  PHQ 2/9 Scores  PHQ - 2 Score 0 0 0  PHQ- 9 Score 0 0     Last fall risk screening    01/27/2023    8:30 AM  Fall Risk   Falls in the past year? 0  Number falls in past yr: 0  Injury with Fall? 0    Last Audit-C alcohol use screening    01/25/2023    2:41 PM  Alcohol Use Disorder Test (AUDIT)  1. How often do you have a drink containing alcohol? 0  3. How often do you have six or more drinks on one occasion? 0   A score of 3 or more in women, and 4 or more in men indicates increased risk for alcohol abuse, EXCEPT if all of the points are from question 1   No results found for any visits on 01/27/23.  Assessment & Plan    Routine Health Maintenance and Physical Exam  Immunization History  Administered Date(s) Administered   Fluad Quad(high Dose 65+) 12/09/2020   Influenza,inj,Quad PF,6+ Mos 10/11/2014, 12/14/2015, 10/22/2016, 12/01/2017, 12/07/2018, 12/12/2019   Influenza-Unspecified 10/29/2021   Moderna Covid-19 Fall Seasonal Vaccine 68yrs & older 10/29/2021, 10/25/2022   PFIZER Comirnaty(Gray Top)Covid-19 Tri-Sucrose Vaccine 07/23/2020   PFIZER(Purple Top)SARS-COV-2 Vaccination 05/01/2019, 05/26/2019, 12/24/2019   PNEUMOCOCCAL CONJUGATE-20 01/23/2022   Pneumococcal Polysaccharide-23 10/22/2016   Tdap 07/01/2011, 12/09/2020   Unspecified SARS-COV-2 Vaccination 12/27/2020   Zoster, Live 10/16/2015    Health Maintenance  Topic Date Due   OPHTHALMOLOGY EXAM  07/26/2022   Diabetic kidney evaluation - Urine ACR  01/28/2023   Zoster Vaccines- Shingrix (1 of 2) 04/27/2023 (Originally 08/04/1974)   INFLUENZA VACCINE  05/10/2023 (Originally 09/10/2022)    COVID-19 Vaccine (8 - 2024-25 season) 07/11/2023 (Originally 12/20/2022)   HEMOGLOBIN A1C  02/17/2023   Diabetic kidney evaluation - eGFR measurement  08/17/2023   FOOT EXAM  01/27/2024   Medicare Annual Wellness (AWV)  01/27/2024   Colonoscopy  11/17/2025   DTaP/Tdap/Td (3 - Td or Tdap) 12/10/2030   Pneumonia Vaccine 63+ Years old  Completed   Hepatitis C Screening  Completed   HPV VACCINES  Aged Out    Problem List Items Addressed This Visit       Cardiovascular and Mediastinum   Essential (primary) hypertension   Relevant Medications   amLODipine-benazepril (LOTREL) 5-20 MG capsule   Other Relevant Orders   BMP8+EGFR     Respiratory   Apnea, sleep     Endocrine   Type 2 diabetes mellitus with hyperglycemia (HCC)   Concerned about diabetes management. Currently on metformin 1000 mg twice daily, with recent increase in dosage. Reports good tolerance to the medication. Blood pressure is well-controlled. BMI is 30, indicating obesity. Discussed the importance of weight loss for diabetes management and overall health. - Order HbA1c test - ordered urine albumin  - Encourage weight loss to improve diabetes management - Continue current medications including metformin and Farxiga - Review results at next visit in March      Relevant Medications   amLODipine-benazepril (LOTREL) 5-20 MG capsule   dapagliflozin propanediol (FARXIGA) 10 MG TABS tablet   Other Relevant Orders   Urine microalbumin-creatinine with uACR   Hemoglobin A1c     Other   Hypercholesteremia   Relevant Medications   amLODipine-benazepril (LOTREL) 5-20 MG capsule   Other Relevant Orders   Lipid panel   Gout   Relevant Medications   allopurinol (ZYLOPRIM) 300 MG tablet   Other Relevant Orders   Uric acid   Encounter for annual wellness visit (AWV) in Medicare patient - Primary   Annual wellness visit completed today including all of the following: -Reviewed patient's family medical history -Reviewed  and updated patient's list of medical providers -Completed assessment of cognitive impairment -Completed assessment of patient's functional ability -Provided patient with recommendations for health screening services as well as vaccines Health risk assessment completed and reviewed -Discussed recommendations for well-balanced diet in addition  to 150 minutes of physical activity per week       Avitaminosis D   Relevant Orders   VITAMIN D 25 Hydroxy (Vit-D Deficiency, Fractures)   Annual physical exam   67 year old presenting for an annual physical visit.   Reports a recent decrease in exercise due to vertigo, which has improved. Completed a comprehensive questionnaire online. - Perform comprehensive physical exam - Review and update medications - Order lab tests including cholesterol, metabolic panel, uric acid, and vitamin D - Performed foot exam - Review eye exam records from [facility]       ADD (attention deficit disorder)   Chronic  Stable  Continue adderall 10mg  daily  Refills sent for 3 months  Follow up in 3 months for controlled substance management PDMP reviewed and accurate       Relevant Medications   amphetamine-dextroamphetamine (ADDERALL) 10 MG tablet (Start on 02/26/2023)   amphetamine-dextroamphetamine (ADDERALL) 10 MG tablet (Start on 03/29/2023)   amphetamine-dextroamphetamine (ADDERALL) 10 MG tablet (Start on 04/26/2023)   Other Visit Diagnoses       BMI 30.0-30.9,adult       Relevant Orders   TSH+T4F+T3Free     Diabetes mellitus without complication (HCC)       Relevant Medications   amLODipine-benazepril (LOTREL) 5-20 MG capsule   dapagliflozin propanediol (FARXIGA) 10 MG TABS tablet        Vertigo Recent episode of vertigo lasting about a month, now improved. Symptoms included dizziness upon sitting up in bed, with no current symptoms. Discussed the Epley maneuver as a non-pharmacological treatment option. - Provide handout on Epley maneuver for  future episodes - Advise on staying hydrated and avoiding sudden movements  General Health Maintenance Up to date on flu and COVID-19 vaccinations. Discussed the shingles vaccine, which is being considered for the future. Explained that shingles is contagious and can lead to complications such as postherpetic neuralgia. Recommended waiting until after the holidays to avoid potential flu-like symptoms from the vaccine. - Discuss shingles vaccine at next visit in March - Encourage continued healthy diet and exercise habits         Return in about 3 months (around 04/27/2023) for ADHD, HTN, DM.       Ronnald Ramp, MD  [facility] [phone] (phone) [phone] (fax)  [facility]

## 2023-01-28 ENCOUNTER — Encounter: Payer: Self-pay | Admitting: Family Medicine

## 2023-01-28 LAB — BMP8+EGFR
BUN/Creatinine Ratio: 15 (ref 10–24)
BUN: 17 mg/dL (ref 8–27)
CO2: 22 mmol/L (ref 20–29)
Calcium: 9.9 mg/dL (ref 8.6–10.2)
Chloride: 100 mmol/L (ref 96–106)
Creatinine, Ser: 1.16 mg/dL (ref 0.76–1.27)
Glucose: 168 mg/dL — ABNORMAL HIGH (ref 70–99)
Potassium: 4.4 mmol/L (ref 3.5–5.2)
Sodium: 137 mmol/L (ref 134–144)
eGFR: 69 mL/min/{1.73_m2} (ref >59)

## 2023-01-28 LAB — LIPID PANEL
Chol/HDL Ratio: 2 {ratio} (ref 0.0–5.0)
Cholesterol, Total: 158 mg/dL (ref 100–199)
HDL: 78 mg/dL (ref >39)
LDL Chol Calc (NIH): 65 mg/dL (ref 0–99)
Triglycerides: 77 mg/dL (ref 0–149)
VLDL Cholesterol Cal: 15 mg/dL (ref 5–40)

## 2023-01-28 LAB — MICROALBUMIN / CREATININE URINE RATIO
Creatinine, Urine: 42.2 mg/dL
Microalb/Creat Ratio: <7 mg/g{creat} (ref 0–29)
Microalbumin, Urine: <3 ug/mL

## 2023-01-28 LAB — HEMOGLOBIN A1C
Est. average glucose Bld gHb Est-mCnc: 192 mg/dL
Hgb A1c MFr Bld: 8.3 % — ABNORMAL HIGH (ref 4.8–5.6)

## 2023-01-28 LAB — TSH+T4F+T3FREE
Free T4: 1.51 ng/dL (ref 0.82–1.77)
T3, Free: 3.1 pg/mL (ref 2.0–4.4)
TSH: 1.32 u[IU]/mL (ref 0.450–4.500)

## 2023-01-28 LAB — VITAMIN D 25 HYDROXY (VIT D DEFICIENCY, FRACTURES): Vit D, 25-Hydroxy: 43.3 ng/mL (ref 30.0–100.0)

## 2023-01-28 LAB — URIC ACID: Uric Acid: 4.7 mg/dL (ref 3.8–8.4)

## 2023-01-28 NOTE — Telephone Encounter (Signed)
Care team updated and records already abstracted to HM.

## 2023-01-29 ENCOUNTER — Other Ambulatory Visit: Payer: Self-pay | Admitting: Family Medicine

## 2023-03-04 ENCOUNTER — Telehealth: Payer: Self-pay | Admitting: Family Medicine

## 2023-03-04 NOTE — Telephone Encounter (Signed)
Copied from CRM [id number]. Topic: Appointments - Transfer of Care >> Mar 03, 2023  4:06 PM Jon Gills C wrote: Pt is requesting to transfer FROM: [facility] Pt is requesting to transfer TO: [place] Reason for requested transfer: No longer takes [employer]  It is the responsibility of the team the patient would like to transfer to (Dr. Milinda Antis ) to reach out to the patient if for any reason this transfer is not acceptable.

## 2023-03-04 NOTE — Telephone Encounter (Signed)
Spoke to pt, offered other providers that are accepting new pts. Pt stated he'd discuss things over with his wife & contact office back with his decision.

## 2023-03-04 NOTE — Telephone Encounter (Signed)
I am not taking new patients over 60 currently  Also - I have doubts that that practice does not take [employer]-since they are [place]  (asked our up front office manager and she doubted it)   please encourage him to call them and re affirm that !

## 2023-03-05 ENCOUNTER — Telehealth: Payer: Self-pay

## 2023-03-05 NOTE — Telephone Encounter (Signed)
 Copied from CRM [id number]. Topic: Appointment Scheduling - Scheduling Inquiry for Clinic >> Mar 04, 2023  2:26 PM Tiffany B wrote: Reason for CRM: Patient thought hs PCP was no longer in network with [facility] and would like the practice to disregard medical release form that was faxed over to his [facility].

## 2023-04-02 ENCOUNTER — Telehealth: Payer: Self-pay

## 2023-04-02 NOTE — Telephone Encounter (Signed)
 Patient was identified as falling into the [place] Measure - Diabetes.   Patient was: Appointment scheduled for lab or office visit for A1c.  Patient being seen at regular intervals.  Last A1C improving

## 2023-04-29 ENCOUNTER — Ambulatory Visit: Payer: Medicare PPO | Admitting: Family Medicine

## 2023-04-29 ENCOUNTER — Encounter: Payer: Self-pay | Admitting: Family Medicine

## 2023-04-29 VITALS — BP 116/67 | HR 88 | Ht 67.0 in | Wt 193.0 lb

## 2023-04-29 DIAGNOSIS — I10 Essential (primary) hypertension: Secondary | ICD-10-CM

## 2023-04-29 DIAGNOSIS — M1A9XX0 Chronic gout, unspecified, without tophus (tophi): Secondary | ICD-10-CM

## 2023-04-29 DIAGNOSIS — M25511 Pain in right shoulder: Secondary | ICD-10-CM

## 2023-04-29 DIAGNOSIS — F900 Attention-deficit hyperactivity disorder, predominantly inattentive type: Secondary | ICD-10-CM

## 2023-04-29 DIAGNOSIS — E1165 Type 2 diabetes mellitus with hyperglycemia: Secondary | ICD-10-CM | POA: Diagnosis not present

## 2023-04-29 DIAGNOSIS — E119 Type 2 diabetes mellitus without complications: Secondary | ICD-10-CM

## 2023-04-29 DIAGNOSIS — G4739 Other sleep apnea: Secondary | ICD-10-CM

## 2023-04-29 DIAGNOSIS — M109 Gout, unspecified: Secondary | ICD-10-CM

## 2023-04-29 DIAGNOSIS — E7800 Pure hypercholesterolemia, unspecified: Secondary | ICD-10-CM

## 2023-04-29 DIAGNOSIS — R41840 Attention and concentration deficit: Secondary | ICD-10-CM

## 2023-04-29 LAB — POCT GLYCOSYLATED HEMOGLOBIN (HGB A1C): Hemoglobin A1C: 7.2 % — AB (ref 4.0–5.6)

## 2023-04-29 MED ORDER — ALLOPURINOL 300 MG PO TABS: ORAL_TABLET | ORAL | 3 refills | Status: DC

## 2023-04-29 MED ORDER — DAPAGLIFLOZIN PROPANEDIOL 10 MG PO TABS: 10.0000 mg | ORAL_TABLET | Freq: Every day | ORAL | 3 refills | Status: DC

## 2023-04-29 MED ORDER — AMPHETAMINE-DEXTROAMPHETAMINE 10 MG PO TABS: ORAL_TABLET | ORAL | 0 refills | Status: DC

## 2023-04-29 MED ORDER — MELOXICAM 7.5 MG PO TABS
7.5000 mg | ORAL_TABLET | Freq: Every day | ORAL | 0 refills | Status: DC
Start: 2023-04-29 — End: 2023-06-08

## 2023-04-29 NOTE — Progress Notes (Signed)
 Established patient visit   Patient: Antonio Whitney   DOB: [birth year]   68 y.o. Male  MRN: [id number] Visit Date: 04/29/2023  Today's healthcare provider: Ronnald Ramp, MD   Chief Complaint  Patient presents with   Hypertension    No concerns (no record) A1c due   Diabetes    No concerns    ADHD    No concern    Shoulder Pain    R shoulder pain from about 2 months ago, he was lifting a tree and felt something pull every since he has this off and on pain    Subjective     HPI     Hypertension    Additional comments: No concerns (no record) A1c due        Diabetes    Additional comments: No concerns         ADHD    Additional comments: No concern         Shoulder Pain    Additional comments: R shoulder pain from about 2 months ago, he was lifting a tree and felt something pull every since he has this off and on pain       Last edited by Thedora Hinders, CMA on 04/29/2023  8:27 AM.       Discussed the use of AI scribe software for clinical note transcription with the patient, who gave verbal consent to proceed.  History of Present Illness Antonio Whitney "Roe Coombs" is a 68 year old male with type 2 diabetes, hypertension, and hyperlipidemia who presents for follow-up.  He has type 2 diabetes with a recent A1c of 8.3% in December 2024. He has been more active, resuming spinning and walking two miles in the evening when possible. His diet focuses on healthy eating with minimal fried foods. He monitors his blood sugar daily, with readings typically between 150 to 170 mg/dL. He has lost three to four pounds recently. He currently takes [place] once daily and metformin 1000 mg twice daily, which was increased from 500 mg once daily since December.  He experiences right shoulder pain that began after moving Christmas tree decorations on January 17th, approximately two months ago. The pain occurs when reaching up, back, or out, and is described as  sometimes being in the joint, bicep, or tricep. 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  He continues to take Adderall 20 mg daily for ADHD, which is working well. No issues with the current regimen.  He uses a CPAP machine for sleep apnea, with a new mask that he finds comfortable and effective, improving his sleep quality.     Past Medical History:  Diagnosis Date   Attention deficit disorder    Diabetes (HCC)    Diverticulosis 03/25/2016   Gout    Hyperlipidemia    Hypertension    Sleep apnea    CPAP    Medications: Outpatient Medications Prior to Visit  Medication Sig   Accu-Chek Softclix Lancets lancets Use as instructed   amLODipine-benazepril (LOTREL) 5-20 MG capsule Take 1 capsule by mouth daily.   amphetamine-dextroamphetamine (ADDERALL) 10 MG tablet TAKE ONE TABLET BY MOUTH EVERY MORNING WITH BREAKFAST   blood glucose meter kit and supplies Dispense based on patient and insurance preference. Check blood sugars every morning before breakfast. (FOR ICD-10 E11.9).   Cholecalciferol (VITAMIN D) 50 MCG (2000 UT) CAPS Take  by mouth.   finasteride (PROSCAR) 5 MG tablet TAKE ONE-HALF TABLET BY MOUTH EVERY OTHER DAY   glucose blood (ACCU-CHEK GUIDE) test strip CHECK BLOOD SUGARS EVERY MORNING BEFORE BREAKFAST   Inulin (METAMUCIL CLEAR & NATURAL) POWD Take by mouth.   metFORMIN (GLUCOPHAGE) 1000 MG tablet Take 1 tablet (1,000 mg total) by mouth 2 (two) times daily with a meal.   NON FORMULARY CPAP at night   Omega-3 Fatty Acids (FISH OIL PO) Take 1,400 mg by mouth.   simvastatin (ZOCOR) 20 MG tablet TAKE 1 TABLET(20 MG) BY MOUTH AT BEDTIME   Zinc 30 MG TABS Take by mouth.   [DISCONTINUED] allopurinol (ZYLOPRIM) 300 MG tablet TAKE 1 TABLET(300 MG) BY MOUTH DAILY   [DISCONTINUED] dapagliflozin propanediol (FARXIGA) 10 MG TABS tablet Take 1 tablet (10 mg total) by mouth daily with  breakfast.   [DISCONTINUED] amphetamine-dextroamphetamine (ADDERALL) 10 MG tablet TAKE ONE TABLET BY MOUTH EVERY MORNING WITH BREAKFAST   [DISCONTINUED] amphetamine-dextroamphetamine (ADDERALL) 10 MG tablet TAKE ONE TABLET BY MOUTH EVERY MORNING WITH BREAKFAST   No facility-administered medications prior to visit.    Review of Systems  Last CBC Lab Results  Component Value Date   WBC 7.5 08/05/2021   HGB 14.0 08/05/2021   HCT 42.0 08/05/2021   MCV 90.7 08/05/2021   MCH 30.2 08/05/2021   RDW 13.0 08/05/2021   PLT 262 08/05/2021   Last metabolic panel Lab Results  Component Value Date   GLUCOSE 168 (H) 01/27/2023   NA 137 01/27/2023   K 4.4 01/27/2023   CL 100 01/27/2023   CO2 22 01/27/2023   BUN 17 01/27/2023   CREATININE 1.16 01/27/2023   EGFR 69 01/27/2023   CALCIUM 9.9 01/27/2023   PROT 7.5 08/17/2022   ALBUMIN 4.7 08/17/2022   LABGLOB 2.8 08/17/2022   AGRATIO 1.7 01/27/2022   BILITOT 0.6 08/17/2022   ALKPHOS 83 08/17/2022   AST 21 08/17/2022   ALT 31 08/17/2022   ANIONGAP 14 08/05/2021   Last lipids Lab Results  Component Value Date   CHOL 158 01/27/2023   HDL 78 01/27/2023   LDLCALC 65 01/27/2023   TRIG 77 01/27/2023   CHOLHDL 2.0 01/27/2023   Last hemoglobin A1c Lab Results  Component Value Date   HGBA1C 7.2 (A) 04/29/2023    Last thyroid functions Lab Results  Component Value Date   TSH 1.320 01/27/2023   Last vitamin D Lab Results  Component Value Date   VD25OH 43.3 01/27/2023        Objective    BP 116/67   Pulse 88   Ht 5\' 7"  (1.702 m)   Wt 193 lb (87.5 kg)   SpO2 98%   BMI 30.23 kg/m  BP Readings from Last 3 Encounters:  04/29/23 116/67  01/27/23 118/76  11/18/22 112/72   Wt Readings from Last 3 Encounters:  04/29/23 193 lb (87.5 kg)  01/27/23 195 lb 6.4 oz (88.6 kg)  11/18/22 196 lb 6.4 oz (89.1 kg)        Physical Exam  General: Alert, no acute distress Cardio: Normal S1 and S2, RRR, no r/m/g Pulm: CTAB, normal  work of breathing   Results for orders placed or performed in visit on 04/29/23  POCT HgB A1C  Result Value Ref Range   Hemoglobin A1C 7.2 (A) 4.0 - 5.6 %   HbA1c POC (<> result, manual entry)     HbA1c, POC (prediabetic range)     HbA1c, POC (controlled diabetic range)  Assessment & Plan     Problem List Items Addressed This Visit       Cardiovascular and Mediastinum   Essential (primary) hypertension   Chronic  Hypertension is currently managed with amlodipine 5mg  and benazepril 20mg .  Blood pressure appears to be well controlled.          Respiratory   Apnea, sleep   Chronic  Stable with CPAP use  Patient advised to continue CPAP nightly  The patient has had significant benefit from the use of CPAP machine with considerable improvements in quality of life, work production and decrease medical complaints.  The patient will need continued maintenance and care CPAP supplies (including upgrades as deemed appropriate) in order to continue treatment for sleep apnea as this is medically necessary.   Obstructive sleep apnea is managed with CPAP therapy. He reports good compliance and satisfaction with the current CPAP setup, including a new mask and machines for home and travel.          Endocrine   Type 2 diabetes mellitus with hyperglycemia (HCC) - Primary   [place] Type 2 diabetes with suboptimal control, as evidenced by an A1c of 8.3% in December 2024. The goal is to achieve an A1c of less than 7%. He has been more active recently, which may help improve glucose control. Current medications include metformin and Farxiga. He is concerned about progressing to insulin therapy. Discussed potential addition of Rybelsus, Ozempic, or Mounjaro if A1c is not at goal. These medications can aid in weight loss and improve A1c but may cause gastrointestinal side effects such as nausea and constipation. They are also beneficial for kidney function protection. Cost and insurance coverage  are considerations.   - Perform point of care A1c test today, improved to 7.2 - Consider starting Rybelsus, Ozempic, or Mounjaro for better glucose control if A1c is not at goal of less than 7  - Continue metformin and Farxiga   - Schedule follow-up in one month to reassess glucose control        Relevant Medications   dapagliflozin propanediol (FARXIGA) 10 MG TABS tablet   Other Relevant Orders   POCT HgB A1C (Completed)     Other   Hypercholesteremia   Hyperlipidemia is managed with simvastatin 20mg  daily - continue current regimen         Gout   Relevant Medications   allopurinol (ZYLOPRIM) 300 MG tablet   ADD (attention deficit disorder)   Chronic  ADHD is currently managed with Adderall 20 mg daily. He reports that the current prescription process is working well.   - Provide three more refills of Adderall 20 mg daily        Relevant Medications   amphetamine-dextroamphetamine (ADDERALL) 10 MG tablet (Start on 05/27/2023)   amphetamine-dextroamphetamine (ADDERALL) 10 MG tablet (Start on 06/28/2023)   amphetamine-dextroamphetamine (ADDERALL) 10 MG tablet (Start on 07/26/2023)   Other Visit Diagnoses       Acute pain of right shoulder       Relevant Medications   meloxicam (MOBIC) 7.5 MG tablet   Other Relevant Orders   DG Shoulder Right     Diabetes mellitus without complication (HCC)       Relevant Medications   dapagliflozin propanediol (FARXIGA) 10 MG TABS tablet        Assessment & Plan Right Shoulder Pain   Right shoulder pain persisting for two months following lifting Christmas tree decorations. Pain is exacerbated by certain movements, suggesting possible tendinitis or micro tears in  the deltoid. No prior treatment with anti-inflammatories.   - Order right shoulder X-ray   - Prescribe meloxicam 7.5 mg daily for inflammation   - Advise against using ibuprofen, naproxen, Aleve, Advil, or Motrin while on meloxicam   - Schedule follow-up in one month to  reassess shoulder pain   - Consider referral to orthopedics if no improvement after one month     General Health Maintenance   He has not received the Shingrix vaccine.   - Recommend Shingrix vaccine     Return in about 1 month (around 05/30/2023) for right shoulder pain .         Ronnald Ramp, MD  [facility] [phone] (phone) [phone] (fax)  [facility]

## 2023-04-29 NOTE — Assessment & Plan Note (Addendum)
 Chronic  Stable with CPAP use  Patient advised to continue CPAP nightly  The patient has had significant benefit from the use of CPAP machine with considerable improvements in quality of life, work production and decrease medical complaints.  The patient will need continued maintenance and care CPAP supplies (including upgrades as deemed appropriate) in order to continue treatment for sleep apnea as this is medically necessary.   Obstructive sleep apnea is managed with CPAP therapy. He reports good compliance and satisfaction with the current CPAP setup, including a new mask and machines for home and travel.

## 2023-04-29 NOTE — Assessment & Plan Note (Signed)
 Hyperlipidemia is managed with simvastatin 20mg  daily - continue current regimen

## 2023-04-29 NOTE — Patient Instructions (Signed)
 Please report to [facility] located at:  [address]  [place], [place] [address]  You do not need an appointment to have xrays completed.   Our office will follow up with  results once available.

## 2023-04-29 NOTE — Assessment & Plan Note (Signed)
 Chronic  Hypertension is currently managed with amlodipine 5mg  and benazepril 20mg .  Blood pressure appears to be well controlled.

## 2023-04-29 NOTE — Assessment & Plan Note (Signed)
 [place] Type 2 diabetes with suboptimal control, as evidenced by an A1c of 8.3% in December 2024. The goal is to achieve an A1c of less than 7%. He has been more active recently, which may help improve glucose control. Current medications include metformin and Farxiga. He is concerned about progressing to insulin therapy. Discussed potential addition of Rybelsus, Ozempic, or Mounjaro if A1c is not at goal. These medications can aid in weight loss and improve A1c but may cause gastrointestinal side effects such as nausea and constipation. They are also beneficial for kidney function protection. Cost and insurance coverage are considerations.   - Perform point of care A1c test today, improved to 7.2 - Consider starting Rybelsus, Ozempic, or Mounjaro for better glucose control if A1c is not at goal of less than 7  - Continue metformin and Farxiga   - Schedule follow-up in one month to reassess glucose control

## 2023-04-29 NOTE — Assessment & Plan Note (Signed)
 Chronic  ADHD is currently managed with Adderall 20 mg daily. He reports that the current prescription process is working well.   - Provide three more refills of Adderall 20 mg daily

## 2023-04-30 ENCOUNTER — Ambulatory Visit
Admission: RE | Admit: 2023-04-30 | Discharge: 2023-04-30 | Disposition: A | Discharge status: Home / Self Care | Source: Ambulatory Visit | Attending: Family Medicine | Admitting: Family Medicine

## 2023-04-30 ENCOUNTER — Ambulatory Visit
Admission: RE | Admit: 2023-04-30 | Discharge: 2023-04-30 | Disposition: A | Discharge status: Home / Self Care | Attending: Family Medicine | Admitting: Family Medicine

## 2023-04-30 DIAGNOSIS — M25511 Pain in right shoulder: Secondary | ICD-10-CM | POA: Diagnosis present

## 2023-05-05 ENCOUNTER — Telehealth: Payer: Self-pay

## 2023-05-05 NOTE — Telephone Encounter (Unsigned)
 Copied from CRM [id number]. Topic: Clinical - Lab/Test Results >> May 05, 2023  3:47 PM [place] B wrote: Reason for CRM: pt called in for imaging results of right shoulder.  Please cb

## 2023-05-06 NOTE — Telephone Encounter (Signed)
Has not been read yet

## 2023-05-06 NOTE — Telephone Encounter (Signed)
 Called and spoke to pt. Advised him that results have not been read yet but as soon as they are, he should receive a call from [place] in regards to his results. Patient verbalized understanding.

## 2023-05-11 ENCOUNTER — Encounter: Payer: Self-pay | Admitting: Family Medicine

## 2023-05-19 ENCOUNTER — Other Ambulatory Visit: Payer: Self-pay | Admitting: Family Medicine

## 2023-05-19 DIAGNOSIS — N400 Enlarged prostate without lower urinary tract symptoms: Secondary | ICD-10-CM

## 2023-05-19 DIAGNOSIS — E7800 Pure hypercholesterolemia, unspecified: Secondary | ICD-10-CM

## 2023-05-19 NOTE — Telephone Encounter (Signed)
 [facility] pharmacy is requesting refill finasteride (PROSCAR) 5 MG tablet  & glucose blood (ACCU-CHEK GUIDE) test strip   & simvastatin (ZOCOR) 20 MG tablet   Please advise

## 2023-05-19 NOTE — Telephone Encounter (Signed)
 LOV 3*20*25 NOV [id number] LRF all 3 request [id number] LABS [id number]

## 2023-05-20 MED ORDER — SIMVASTATIN 20 MG PO TABS: ORAL_TABLET | ORAL | 3 refills | Status: DC

## 2023-05-20 MED ORDER — LANCET DEVICE MISC: 1.0000 | Freq: Three times a day (TID) | 0 refills | Status: AC

## 2023-05-20 MED ORDER — BLOOD GLUCOSE TEST VI STRP: 1.0000 | ORAL_STRIP | Freq: Three times a day (TID) | 6 refills | Status: AC

## 2023-05-20 MED ORDER — LANCETS MISC. MISC: 1.0000 | Freq: Three times a day (TID) | 0 refills | Status: AC

## 2023-05-20 MED ORDER — BLOOD GLUCOSE MONITORING SUPPL DEVI: 1.0000 | Freq: Three times a day (TID) | 0 refills | Status: AC

## 2023-05-20 MED ORDER — FINASTERIDE 5 MG PO TABS: ORAL_TABLET | ORAL | 3 refills | Status: AC

## 2023-05-21 ENCOUNTER — Telehealth: Payer: Self-pay | Admitting: Family Medicine

## 2023-05-21 DIAGNOSIS — E1165 Type 2 diabetes mellitus with hyperglycemia: Secondary | ICD-10-CM

## 2023-05-21 NOTE — Telephone Encounter (Signed)
 [facility] pharmacy is requesting refill Glucose Blood (BLOOD GLUCOSE TEST STRIPS) STRP   Please advise

## 2023-05-24 NOTE — Telephone Encounter (Signed)
 2nd request /[facility] pharmacy faxed refill request for the following medications:   glucose blood (ACCU-CHEK GUIDE) test strip    Please advise

## 2023-05-25 NOTE — Telephone Encounter (Signed)
 Called and spoke to the pharmacy, the test scripts was sent on 4/10 she verbally stated she received it and would have it filled today

## 2023-06-08 ENCOUNTER — Ambulatory Visit: Admitting: Family Medicine

## 2023-06-08 ENCOUNTER — Encounter: Payer: Self-pay | Admitting: Family Medicine

## 2023-06-08 VITALS — BP 116/75 | HR 83 | Ht 67.0 in | Wt 192.0 lb

## 2023-06-08 DIAGNOSIS — F900 Attention-deficit hyperactivity disorder, predominantly inattentive type: Secondary | ICD-10-CM

## 2023-06-08 DIAGNOSIS — Z7984 Long term (current) use of oral hypoglycemic drugs: Secondary | ICD-10-CM

## 2023-06-08 DIAGNOSIS — M25511 Pain in right shoulder: Secondary | ICD-10-CM

## 2023-06-08 DIAGNOSIS — I10 Essential (primary) hypertension: Secondary | ICD-10-CM

## 2023-06-08 DIAGNOSIS — E1165 Type 2 diabetes mellitus with hyperglycemia: Secondary | ICD-10-CM | POA: Diagnosis not present

## 2023-06-08 MED ORDER — MELOXICAM 15 MG PO TABS: 15.0000 mg | ORAL_TABLET | Freq: Every day | ORAL | 0 refills | Status: DC

## 2023-06-08 NOTE — Assessment & Plan Note (Signed)
 Type 2 diabetes mellitus with recent A1c of 7.2%. Morning blood glucose levels range from 157 to 175 mg/dL. He is more active, walking more, and starting to spin in the morning. He has lost a pound or two and is continuing to lose weight slowly. He is aware of the importance of maintaining blood glucose levels and is actively managing diet and exercise. No significant fluctuations in blood glucose levels. He is using a new glucose meter and will continue to monitor blood glucose once daily. Discussion about the potential impact of past heavy alcohol use on insulin resistance, with reassurance that liver function is normal and no current alcohol use. He is aware that achieving blood glucose levels closer to 130-140 mg/dL will help improve Z6X. - Continue daily blood glucose monitoring with the new glucose meter. - Encourage continued physical activity and dietary management to achieve blood glucose levels closer to 130-140 mg/dL. - continue to use glucose meter to check AM fasting glucose levels  - Plan for repeat A1c test in June. - Discuss potential need for further intervention if A1c does not improve. - Continue metformin  100mg  BID and Farxiga  10mg  daily

## 2023-06-08 NOTE — Progress Notes (Signed)
 Established patient visit   Patient: Antonio Whitney   DOB: 1955/03/10   68 y.o. Male  MRN: [id number] Visit Date: 06/08/2023  Today's healthcare provider: Mimi Alt, MD   Chief Complaint  Patient presents with   Shoulder Pain    The pain is not gone but better, all testing results came back normal   Subjective     HPI     Shoulder Pain    Additional comments: The pain is not gone but better, all testing results came back normal      Last edited by Bart Lieu, CMA on 06/08/2023  8:30 AM.       Discussed the use of AI scribe software for clinical note transcription with the patient, who gave verbal consent to proceed.  History of Present Illness Antonio Whitney "Antonio Whitney" is a 68 year old male with diabetes who presents for follow-up of right shoulder pain and blood sugar monitoring.  He has been experiencing right shoulder pain since January, which began while handling a Christmas tree bag. The pain has improved by 30-40% with meloxicam  7.5 mg daily but is not completely resolved. It is noticeable only during certain movements, particularly when reaching out, but is not debilitating. He maintains a good range of motion and does not experience pain when sitting or performing certain activities. He has not been lifting weights recently but plans to start light exercises again. A right shoulder x-ray in March showed no fracture, dislocation, or arthritis.  He manages his diabetes with once-daily blood sugar checks, which average between 157-174 mg/dL in the morning. He recently received a new glucose meter and is unsure if he needs to increase the frequency of testing. He is more active now, walking more and starting to spin in the mornings, and has lost a pound or two.  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 He is concerned about the long-term effects of past alcohol use on his liver and pancreas, but his recent  lab results have been normal. He does not smoke and is conscious of maintaining a healthy diet, avoiding red meat and fried foods, and is working on reducing snacking.     Past Medical History:  Diagnosis Date   Attention deficit disorder    Diabetes (HCC)    Diverticulosis 03/25/2016   Gout    Hyperlipidemia    Hypertension    Sleep apnea    CPAP    Medications: Outpatient Medications Prior to Visit  Medication Sig   Accu-Chek Softclix Lancets lancets Use as instructed   allopurinol  (ZYLOPRIM ) 300 MG tablet TAKE 1 TABLET(300 MG) BY MOUTH DAILY   amLODipine -benazepril  (LOTREL) 5-20 MG capsule Take 1 capsule by mouth daily.   amphetamine -dextroamphetamine  (ADDERALL) 10 MG tablet TAKE ONE TABLET BY MOUTH EVERY MORNING WITH BREAKFAST   amphetamine -dextroamphetamine  (ADDERALL) 10 MG tablet TAKE ONE TABLET BY MOUTH EVERY MORNING WITH BREAKFAST   [START ON 06/28/2023] amphetamine -dextroamphetamine  (ADDERALL) 10 MG tablet TAKE ONE TABLET BY MOUTH EVERY MORNING WITH BREAKFAST   [START ON 07/26/2023] amphetamine -dextroamphetamine  (ADDERALL) 10 MG tablet TAKE ONE TABLET BY MOUTH EVERY MORNING WITH BREAKFAST   blood glucose meter kit and supplies Dispense based on patient and insurance preference. Check blood sugars every morning before breakfast. (FOR ICD-10 E11.9).   Blood Glucose Monitoring Suppl DEVI 1 each by Does not apply route in the morning, at noon, and at bedtime. May substitute to any manufacturer covered  by patient's insurance.   Cholecalciferol (VITAMIN D ) 50 MCG (2000 UT) CAPS Take by mouth.   dapagliflozin  propanediol (FARXIGA ) 10 MG TABS tablet Take 1 tablet (10 mg total) by mouth daily with breakfast.   finasteride  (PROSCAR ) 5 MG tablet TAKE ONE-HALF TABLET BY MOUTH EVERY OTHER DAY   glucose blood (ACCU-CHEK GUIDE) test strip CHECK BLOOD SUGARS EVERY MORNING BEFORE BREAKFAST   Glucose Blood (BLOOD GLUCOSE TEST STRIPS) STRP 1 each by In Vitro route in the morning, at noon, and at  bedtime. May substitute to any manufacturer covered by patient's insurance.   Inulin (METAMUCIL CLEAR & NATURAL) POWD Take by mouth.   Lancet Device MISC 1 each by Does not apply route in the morning, at noon, and at bedtime. May substitute to any manufacturer covered by patient's insurance.   Lancets Misc. MISC 1 each by Does not apply route in the morning, at noon, and at bedtime. May substitute to any manufacturer covered by patient's insurance.   metFORMIN  (GLUCOPHAGE ) 1000 MG tablet Take 1 tablet (1,000 mg total) by mouth 2 (two) times daily with a meal.   NON FORMULARY CPAP at night   Omega-3 Fatty Acids (FISH OIL PO) Take 1,400 mg by mouth.   simvastatin  (ZOCOR ) 20 MG tablet TAKE 1 TABLET(20 MG) BY MOUTH AT BEDTIME   Zinc 30 MG TABS Take by mouth.   [DISCONTINUED] meloxicam  (MOBIC ) 7.5 MG tablet Take 1 tablet (7.5 mg total) by mouth daily.   No facility-administered medications prior to visit.    Review of Systems  Last metabolic panel Lab Results  Component Value Date   GLUCOSE 168 (H) 01/27/2023   NA 137 01/27/2023   K 4.4 01/27/2023   CL 100 01/27/2023   CO2 22 01/27/2023   BUN 17 01/27/2023   CREATININE 1.16 01/27/2023   EGFR 69 01/27/2023   CALCIUM 9.9 01/27/2023   PROT 7.5 08/17/2022   ALBUMIN 4.7 08/17/2022   LABGLOB 2.8 08/17/2022   AGRATIO 1.7 01/27/2022   BILITOT 0.6 08/17/2022   ALKPHOS 83 08/17/2022   AST 21 08/17/2022   ALT 31 08/17/2022   ANIONGAP 14 08/05/2021   Last lipids Lab Results  Component Value Date   CHOL 158 01/27/2023   HDL 78 01/27/2023   LDLCALC 65 01/27/2023   TRIG 77 01/27/2023   CHOLHDL 2.0 01/27/2023   Last hemoglobin A1c Lab Results  Component Value Date   HGBA1C 7.2 (A) 04/29/2023        Objective    BP 116/75   Pulse 83   Ht 5\' 7"  (1.702 m)   Wt 192 lb (87.1 kg)   SpO2 100%   BMI 30.07 kg/m  BP Readings from Last 3 Encounters:  06/08/23 116/75  04/29/23 116/67  01/27/23 118/76   Wt Readings from Last 3  Encounters:  06/08/23 192 lb (87.1 kg)  04/29/23 193 lb (87.5 kg)  01/27/23 195 lb 6.4 oz (88.6 kg)        Physical Exam   General: Alert, no acute distress Cardio: Normal S1 and S2, RRR, no r/m/g Pulm: CTAB, normal work of breathing ABD: normal bowel sounds   Shoulder: Inspection reveals no obvious deformity, atrophy, or asymmetry. No bruising. No swelling Palpation is normal with no TTP over [facility] joint or bicipital groove. Full ROM in flexion, abduction, internal/external rotation NV intact distally  Special Tests:  - Impingement: Neg Hawkins, neers, empty can sign. - Supraspinatous: Negative empty can.  5/5 strength with resisted flexion at 20 degrees -  Infraspinatous/Teres Minor: 5/5 strength with ER. Negative pain with resisted ER  No results found for any visits on 06/08/23.  Assessment & Plan     Problem List Items Addressed This Visit       Cardiovascular and Mediastinum   Essential (primary) hypertension   Chronic  Hypertension is currently managed with amlodipine  5mg  and benazepril  20mg .  Blood pressure appears to be well controlled.   -continue current medications         Endocrine   Type 2 diabetes mellitus with hyperglycemia (HCC)   Type 2 diabetes mellitus with recent A1c of 7.2%. Morning blood glucose levels range from 157 to 175 mg/dL. He is more active, walking more, and starting to spin in the morning. He has lost a pound or two and is continuing to lose weight slowly. He is aware of the importance of maintaining blood glucose levels and is actively managing diet and exercise. No significant fluctuations in blood glucose levels. He is using a new glucose meter and will continue to monitor blood glucose once daily. Discussion about the potential impact of past heavy alcohol use on insulin resistance, with reassurance that liver function is normal and no current alcohol use. He is aware that achieving blood glucose levels closer to 130-140 mg/dL will help  improve Z6X. - Continue daily blood glucose monitoring with the new glucose meter. - Encourage continued physical activity and dietary management to achieve blood glucose levels closer to 130-140 mg/dL. - continue to use glucose meter to check AM fasting glucose levels  - Plan for repeat A1c test in June. - Discuss potential need for further intervention if A1c does not improve. - Continue metformin  100mg  BID and Farxiga  10mg  daily        Other   ADD (attention deficit disorder)   Chronic  ADHD is currently managed with Adderall 20 mg daily. He reports that the current prescription process is working well.   - continue  Adderall 20 mg daily        Other Visit Diagnoses       Acute pain of right shoulder    -  Primary   Relevant Medications   meloxicam  (MOBIC ) 15 MG tablet      Assessment & Plan    Right shoulder pain Chronic right shoulder pain since January, with 30-40% improvement on meloxicam  7.5 mg daily. X-ray from March showed no fracture, dislocation, or arthritis. Pain is not debilitating and occurs with certain movements, particularly reaching out. Good range of motion. Likely strain or minor ligament/tendon issue rather than a tear. No indication for surgery or immediate referral to orthopedics. He prefers to manage at home and avoid unnecessary interventions. Increasing meloxicam  to 15 mg daily to manage inflammation and pain. If pain persists or worsens by June, further imaging or referral to orthopedics will be considered. - Increase meloxicam  to 15 mg daily to manage inflammation and pain. - Reassess shoulder pain in June. Consider further imaging or referral to orthopedics if pain persists or worsens.     No follow-ups on file.         Mimi Alt, MD  [facility] [phone] (phone) [phone] (fax)  [facility]

## 2023-06-08 NOTE — Assessment & Plan Note (Signed)
 Chronic  Hypertension is currently managed with amlodipine  5mg  and benazepril  20mg .  Blood pressure appears to be well controlled.   -continue current medications

## 2023-06-08 NOTE — Assessment & Plan Note (Signed)
 Chronic  ADHD is currently managed with Adderall 20 mg daily. He reports that the current prescription process is working well.   - continue  Adderall 20 mg daily

## 2023-07-23 ENCOUNTER — Telehealth: Payer: Self-pay | Admitting: Family Medicine

## 2023-07-23 DIAGNOSIS — E1165 Type 2 diabetes mellitus with hyperglycemia: Secondary | ICD-10-CM

## 2023-07-23 MED ORDER — ACCU-CHEK SOFTCLIX LANCETS MISC: 12 refills | Status: AC

## 2023-07-23 NOTE — Telephone Encounter (Signed)
 [facility] pharmacy is requesting refill Accu-Chek Softclix Lancets lancets  Please advise

## 2023-07-23 NOTE — Addendum Note (Signed)
 Addended by: D[place] End on: 07/23/2023 10:54 AM   Modules accepted: Orders

## 2023-07-28 LAB — HM DIABETES EYE EXAM

## 2023-08-04 ENCOUNTER — Ambulatory Visit: Admitting: Family Medicine

## 2023-08-17 ENCOUNTER — Ambulatory Visit: Admitting: Family Medicine

## 2023-08-17 VITALS — BP 127/72 | HR 102 | Ht 67.0 in | Wt 191.0 lb

## 2023-08-17 DIAGNOSIS — G8929 Other chronic pain: Secondary | ICD-10-CM

## 2023-08-17 DIAGNOSIS — M25511 Pain in right shoulder: Secondary | ICD-10-CM | POA: Diagnosis not present

## 2023-08-17 DIAGNOSIS — E1165 Type 2 diabetes mellitus with hyperglycemia: Secondary | ICD-10-CM | POA: Diagnosis not present

## 2023-08-17 DIAGNOSIS — F900 Attention-deficit hyperactivity disorder, predominantly inattentive type: Secondary | ICD-10-CM | POA: Diagnosis not present

## 2023-08-17 DIAGNOSIS — E7800 Pure hypercholesterolemia, unspecified: Secondary | ICD-10-CM

## 2023-08-17 DIAGNOSIS — R41840 Attention and concentration deficit: Secondary | ICD-10-CM

## 2023-08-17 DIAGNOSIS — I10 Essential (primary) hypertension: Secondary | ICD-10-CM | POA: Diagnosis not present

## 2023-08-17 LAB — POCT GLYCOSYLATED HEMOGLOBIN (HGB A1C): Hemoglobin A1C: 7.3 % — AB (ref 4.0–5.6)

## 2023-08-17 MED ORDER — MELOXICAM 15 MG PO TABS: 15.0000 mg | ORAL_TABLET | Freq: Every day | ORAL | 0 refills | Status: AC

## 2023-08-17 MED ORDER — AMPHETAMINE-DEXTROAMPHETAMINE 10 MG PO TABS: ORAL_TABLET | ORAL | 0 refills | Status: DC

## 2023-08-17 MED ORDER — METFORMIN HCL 1000 MG PO TABS: 1000.0000 mg | ORAL_TABLET | Freq: Two times a day (BID) | ORAL | 3 refills | Status: AC

## 2023-08-17 NOTE — Progress Notes (Unsigned)
 Established patient visit   Patient: Antonio Whitney   DOB: [birth year]   68 y.o. Male  MRN: [id number] Visit Date: 08/17/2023  Today's healthcare provider: Rockie Agent, MD   Chief Complaint  Patient presents with   Diabetes   Subjective       Discussed the use of AI scribe software for clinical note transcription with the patient, who gave verbal consent to proceed.  History of Present Illness Antonio Whitney is a 68 year old male with chronic diabetes and hypertension who presents for a follow-up visit.  He is here for a follow-up on his chronic diabetes. His last A1c was measured four months ago and was 7.2, with a goal of less than 7. He manages his diabetes with metformin  1000 mg twice daily and Farxiga  10 mg daily. He has a recent 90-day average blood glucose of 174, which he feels is higher than desired. He attributes this to a recent decrease in physical activity due to travel, illness, and weather conditions, but he has resumed his morning exercise routine.  He also has chronic, well-controlled hypertension, managed with amlodipine  and benazepril  20 mg daily. His blood pressure is usually good.  He has a history of ADD, which is stable and well-managed with Adderall 10 mg daily. No issues with this medication.  He experiences pain in his right shoulder, which has improved with an increased dose of meloxicam  from 7.5 mg to 15 mg daily. The pain is described as a 'twinge' that occurs occasionally, particularly when reaching out and bringing back objects. He is concerned about the possibility of a pinched nerve or other underlying issues, as his mother had a history of cancer that metastasized.  He is currently taking allopurinol  300 mg, simvastatin , and finasteride , and reports no issues with these medications. He recently had an eye exam on June 18th, which showed no signs of diabetic retinopathy. He also received a COVID vaccine on May  23rd.     Past Medical History:  Diagnosis Date   Attention deficit disorder    Diabetes (HCC)    Diverticulosis 03/25/2016   Gout    Hyperlipidemia    Hypertension    Sleep apnea    CPAP    Medications: Outpatient Medications Prior to Visit  Medication Sig   Accu-Chek Softclix Lancets lancets Use as instructed   allopurinol  (ZYLOPRIM ) 300 MG tablet TAKE 1 TABLET(300 MG) BY MOUTH DAILY   amLODipine -benazepril  (LOTREL) 5-20 MG capsule Take 1 capsule by mouth daily.   blood glucose meter kit and supplies Dispense based on patient and insurance preference. Check blood sugars every morning before breakfast. (FOR ICD-10 E11.9).   Blood Glucose Monitoring Suppl DEVI 1 each by Does not apply route in the morning, at noon, and at bedtime. May substitute to any manufacturer covered by patient's insurance.   Cholecalciferol (VITAMIN D ) 50 MCG (2000 UT) CAPS Take by mouth.   dapagliflozin  propanediol (FARXIGA ) 10 MG TABS tablet Take 1 tablet (10 mg total) by mouth daily with breakfast.   finasteride  (PROSCAR ) 5 MG tablet TAKE ONE-HALF TABLET BY MOUTH EVERY OTHER DAY   glucose blood (ACCU-CHEK GUIDE) test strip CHECK BLOOD SUGARS EVERY MORNING BEFORE BREAKFAST   Inulin (METAMUCIL CLEAR & NATURAL) POWD Take by mouth.   NON FORMULARY CPAP at night   Omega-3 Fatty Acids (FISH OIL PO) Take 1,400 mg by mouth.   simvastatin  (ZOCOR ) 20 MG tablet TAKE 1 TABLET(20 MG) BY MOUTH AT BEDTIME  Zinc 30 MG TABS Take by mouth.   [DISCONTINUED] amphetamine -dextroamphetamine  (ADDERALL) 10 MG tablet TAKE ONE TABLET BY MOUTH EVERY MORNING WITH BREAKFAST   [DISCONTINUED] amphetamine -dextroamphetamine  (ADDERALL) 10 MG tablet TAKE ONE TABLET BY MOUTH EVERY MORNING WITH BREAKFAST   [DISCONTINUED] amphetamine -dextroamphetamine  (ADDERALL) 10 MG tablet TAKE ONE TABLET BY MOUTH EVERY MORNING WITH BREAKFAST   [DISCONTINUED] amphetamine -dextroamphetamine  (ADDERALL) 10 MG tablet TAKE ONE TABLET BY MOUTH EVERY MORNING WITH  BREAKFAST   [DISCONTINUED] meloxicam  (MOBIC ) 15 MG tablet Take 1 tablet (15 mg total) by mouth daily.   [DISCONTINUED] metFORMIN  (GLUCOPHAGE ) 1000 MG tablet Take 1 tablet (1,000 mg total) by mouth 2 (two) times daily with a meal.   No facility-administered medications prior to visit.    Review of Systems  Last metabolic panel Lab Results  Component Value Date   GLUCOSE 168 (H) 01/27/2023   NA 137 01/27/2023   K 4.4 01/27/2023   CL 100 01/27/2023   CO2 22 01/27/2023   BUN 17 01/27/2023   CREATININE 1.16 01/27/2023   EGFR 69 01/27/2023   CALCIUM 9.9 01/27/2023   PROT 7.5 08/17/2022   ALBUMIN 4.7 08/17/2022   LABGLOB 2.8 08/17/2022   AGRATIO 1.7 01/27/2022   BILITOT 0.6 08/17/2022   ALKPHOS 83 08/17/2022   AST 21 08/17/2022   ALT 31 08/17/2022   ANIONGAP 14 08/05/2021   Last lipids Lab Results  Component Value Date   CHOL 158 01/27/2023   HDL 78 01/27/2023   LDLCALC 65 01/27/2023   TRIG 77 01/27/2023   CHOLHDL 2.0 01/27/2023   Last hemoglobin A1c Lab Results  Component Value Date   HGBA1C 7.3 (A) 08/17/2023        Objective    BP 127/72   Pulse (!) 102   Ht 5' 7 (1.702 m)   Wt 191 lb (86.6 kg)   SpO2 94%   BMI 29.91 kg/m  BP Readings from Last 3 Encounters:  08/17/23 127/72  06/08/23 116/75  04/29/23 116/67   Wt Readings from Last 3 Encounters:  08/17/23 191 lb (86.6 kg)  06/08/23 192 lb (87.1 kg)  04/29/23 193 lb (87.5 kg)        Physical Exam  General: Alert, no acute distress Cardio: RRR, no r/m/g Pulm: CTAB, normal work of breathing Physical Exam  MUSCULOSKELETAL: Right shoulder normal range of motion, 5/5 strength. Tender along posterior lateral edge of deltoid muscle with no erythema or swelling/deformity noted      Results for orders placed or performed in visit on 08/17/23  POCT HgB A1C  Result Value Ref Range   Hemoglobin A1C 7.3 (A) 4.0 - 5.6 %   HbA1c POC (<> result, manual entry)     HbA1c, POC (prediabetic range)      HbA1c, POC (controlled diabetic range)      Assessment & Plan     Problem List Items Addressed This Visit       Cardiovascular and Mediastinum   Essential (primary) hypertension   Hypertension Chronic, well-controlled hypertension. Current blood pressure is 127/72 mmHg, which is slightly higher than usual but still within acceptable range. On amlodipine  and benazepril . - Continue amlodipine -benazepril  5-20mg  daily          Endocrine   Type 2 diabetes mellitus with hyperglycemia (HCC) - Primary   Type 2 Diabetes Mellitus Chronic Type 2 Diabetes Mellitus with last A1c of 7.2% four months ago. Goal is to maintain A1c below 7%. Reports a 90-day average blood glucose of 174 mg/dL, which is higher  than desired. Has not been as active recently due to travel and illness but is resuming exercise routine. Discussed the importance of lifestyle modifications in managing diabetes. Agreed to perform A1c test today to reassess current status. - Refill metformin  1000 mg twice daily - Refill Farxiga  10 mg daily - Perform A1c test today, 7.3% - Follow up in November to reassess A1c and diabetes management      Relevant Medications   metFORMIN  (GLUCOPHAGE ) 1000 MG tablet   Other Relevant Orders   POCT HgB A1C (Completed)     Other   Hypercholesteremia   Hyperlipidemia is managed with simvastatin  20mg  daily Chronic - continue Simvastatin  20mg  daily        ADD (attention deficit disorder)   Attention Deficit Disorder (ADD) Chronic ADD managed with Adderall 10 mg daily. Reports no issues with current management. - Refill Adderall 10 mg daily      Relevant Medications   amphetamine -dextroamphetamine  (ADDERALL) 10 MG tablet (Start on 11/16/2023)   amphetamine -dextroamphetamine  (ADDERALL) 10 MG tablet (Start on 10/18/2023)   amphetamine -dextroamphetamine  (ADDERALL) 10 MG tablet (Start on 09/16/2023)   amphetamine -dextroamphetamine  (ADDERALL) 10 MG tablet   Other Visit Diagnoses       Chronic  right shoulder pain       Relevant Medications   meloxicam  (MOBIC ) 15 MG tablet   Other Relevant Orders   AMB referral to orthopedics     Acute pain of right shoulder       Relevant Medications   meloxicam  (MOBIC ) 15 MG tablet        Assessment & Plan    Right Shoulder Pain Intermittent right shoulder pain with tenderness upon certain movements, particularly when reaching. Pain is less frequent and less severe than before. Differential diagnosis includes possible pinched nerve or soft tissue issue. Previous x-rays did not indicate bone abnormalities. Discussed the option of an ultrasound to assess soft tissue, with the possibility of referral to orthopedics for further evaluation if needed. - Refer to orthopedics for further evaluation and possible ultrasound to assess soft tissue    General Health Maintenance Up to date with COVID-19 vaccination and had a recent eye exam with no diabetic retinopathy detected. Discussed the importance of shingles vaccination. Shingrix vaccine is recommended, which is 90% effective in preventing shingles. Advised to obtain the vaccine at a pharmacy due to cost considerations. - Recommend Shingrix vaccine, two doses, two months apart, to prevent shingles - Pt to Obtain Shingrix vaccine at pharmacy Request Eye exam completed 07/28/23 [facility] [place]  Request records: Covid booster 07/02/23 at [facility] in [place]   No follow-ups on file.         Rockie Agent, MD  [facility] [phone] (phone) [phone] (fax)  [facility]

## 2023-08-18 ENCOUNTER — Ambulatory Visit: Payer: Self-pay | Admitting: Family Medicine

## 2023-08-18 NOTE — Assessment & Plan Note (Signed)
 Attention Deficit Disorder (ADD) Chronic ADD managed with Adderall 10 mg daily. Reports no issues with current management. - Refill Adderall 10 mg daily

## 2023-08-18 NOTE — Assessment & Plan Note (Signed)
 Hyperlipidemia is managed with simvastatin  20mg  daily Chronic - continue Simvastatin  20mg  daily

## 2023-08-18 NOTE — Assessment & Plan Note (Signed)
 Hypertension Chronic, well-controlled hypertension. Current blood pressure is 127/72 mmHg, which is slightly higher than usual but still within acceptable range. On amlodipine  and benazepril . - Continue amlodipine -benazepril  5-20mg  daily

## 2023-08-18 NOTE — Assessment & Plan Note (Signed)
 Type 2 Diabetes Mellitus Chronic Type 2 Diabetes Mellitus with last A1c of 7.2% four months ago. Goal is to maintain A1c below 7%. Reports a 90-day average blood glucose of 174 mg/dL, which is higher than desired. Has not been as active recently due to travel and illness but is resuming exercise routine. Discussed the importance of lifestyle modifications in managing diabetes. Agreed to perform A1c test today to reassess current status. - Refill metformin  1000 mg twice daily - Refill Farxiga  10 mg daily - Perform A1c test today, 7.3% - Follow up in November to reassess A1c and diabetes management

## 2023-09-14 DIAGNOSIS — M75101 Unspecified rotator cuff tear or rupture of right shoulder, not specified as traumatic: Secondary | ICD-10-CM | POA: Diagnosis not present

## 2023-11-08 NOTE — Progress Notes (Signed)
 Antonio Whitney                                          MRN: [id number]   11/08/2023   The [facility] Quality Team Specialist reviewed this patient medical record for the purposes of chart review for care gap closure. The following were reviewed: chart review for care gap closure-kidney health evaluation for diabetes:eGFR  and uACR.    [facility] Quality Team

## 2023-12-17 ENCOUNTER — Telehealth: Payer: Self-pay | Admitting: Family Medicine

## 2023-12-17 ENCOUNTER — Other Ambulatory Visit: Payer: Self-pay

## 2023-12-17 DIAGNOSIS — I10 Essential (primary) hypertension: Secondary | ICD-10-CM

## 2023-12-17 MED ORDER — AMLODIPINE BESY-BENAZEPRIL HCL 5-20 MG PO CAPS: 1.0000 | ORAL_CAPSULE | Freq: Every day | ORAL | 2 refills | Status: AC

## 2023-12-17 NOTE — Telephone Encounter (Signed)
 Converted

## 2023-12-17 NOTE — Telephone Encounter (Signed)
 [facility] faxed refill request for the following medications:  amLODipine -benazepril  (LOTREL) 5-20 MG capsule    Please advise.

## 2023-12-21 ENCOUNTER — Ambulatory Visit: Admitting: Family Medicine

## 2023-12-21 ENCOUNTER — Encounter: Payer: Self-pay | Admitting: Family Medicine

## 2023-12-21 VITALS — BP 119/81 | HR 85 | Ht 67.0 in | Wt 190.4 lb

## 2023-12-21 DIAGNOSIS — G4739 Other sleep apnea: Secondary | ICD-10-CM

## 2023-12-21 DIAGNOSIS — E1159 Type 2 diabetes mellitus with other circulatory complications: Secondary | ICD-10-CM

## 2023-12-21 DIAGNOSIS — M1A9XX0 Chronic gout, unspecified, without tophus (tophi): Secondary | ICD-10-CM

## 2023-12-21 DIAGNOSIS — Z7984 Long term (current) use of oral hypoglycemic drugs: Secondary | ICD-10-CM

## 2023-12-21 DIAGNOSIS — E559 Vitamin D deficiency, unspecified: Secondary | ICD-10-CM | POA: Diagnosis not present

## 2023-12-21 DIAGNOSIS — I10 Essential (primary) hypertension: Secondary | ICD-10-CM

## 2023-12-21 DIAGNOSIS — R41840 Attention and concentration deficit: Secondary | ICD-10-CM

## 2023-12-21 DIAGNOSIS — I152 Hypertension secondary to endocrine disorders: Secondary | ICD-10-CM

## 2023-12-21 DIAGNOSIS — E119 Type 2 diabetes mellitus without complications: Secondary | ICD-10-CM

## 2023-12-21 DIAGNOSIS — E7800 Pure hypercholesterolemia, unspecified: Secondary | ICD-10-CM

## 2023-12-21 DIAGNOSIS — E1165 Type 2 diabetes mellitus with hyperglycemia: Secondary | ICD-10-CM

## 2023-12-21 DIAGNOSIS — F900 Attention-deficit hyperactivity disorder, predominantly inattentive type: Secondary | ICD-10-CM

## 2023-12-21 MED ORDER — AMPHETAMINE-DEXTROAMPHETAMINE 10 MG PO TABS: ORAL_TABLET | ORAL | 0 refills | Status: DC

## 2023-12-21 MED ORDER — DAPAGLIFLOZIN PROPANEDIOL 10 MG PO TABS: 10.0000 mg | ORAL_TABLET | Freq: Every day | ORAL | 3 refills | Status: DC

## 2023-12-21 MED ORDER — BLOOD GLUCOSE TEST STRIPS 333 VI STRP: ORAL_STRIP | 3 refills | Status: AC

## 2023-12-21 NOTE — Assessment & Plan Note (Signed)
 Type 2 diabetes mellitus Chronic condition with recent morning blood glucose levels in the upper 160s to lower 170s, previously around 150s. A1c has been in the low sevens. Concerns about potential increase in A1c due to decreased exercise. - Continue Farxiga  2 mg daily - Continue metformin  1000 mg twice daily - Ordered updated A1c - Provided new glucose monitoring supplies

## 2023-12-21 NOTE — Assessment & Plan Note (Addendum)
  Chronic condition well-controlled with current medication regimen. Blood pressure today is 119/81 mmHg. - Continue amlodipine -benazepril  5-20 mg daily

## 2023-12-21 NOTE — Patient Instructions (Signed)
 To keep you healthy, please keep in mind the following health maintenance items that you are due for:   Health Maintenance Due  Topic Date Due   Zoster Vaccines- Shingrix (1 of 2) [birth year]/1974   Medicare Annual Wellness (AWV)  01/27/2024     Best Wishes,   Dr. Lang

## 2023-12-21 NOTE — Assessment & Plan Note (Signed)
 Chronic condition managed with vitamin D  supplementation. He takes vitamin D  2000 units every other day. - Ordered updated vitamin D  levels

## 2023-12-21 NOTE — Assessment & Plan Note (Signed)
 Chronic  Stable with CPAP use  Patient advised to continue CPAP nightly  The patient has had significant benefit from the use of CPAP machine with considerable improvements in quality of life, work production and decrease medical complaints.  The patient will need continued maintenance and care CPAP supplies (including upgrades as deemed appropriate) in order to continue treatment for sleep apnea as this is medically necessary.   Obstructive sleep apnea is managed with CPAP therapy. He reports good compliance and satisfaction with the current CPAP setup, including a new mask and machines for home and travel.

## 2023-12-21 NOTE — Assessment & Plan Note (Signed)
 Attention Deficit Disorder (ADD) Chronic ADD managed with Adderall 10 mg daily. Reports no issues with current management. - Refill Adderall 10 mg daily, continue current regimen

## 2023-12-21 NOTE — Progress Notes (Signed)
 Established patient visit   Patient: Antonio Whitney   DOB: 1956/01/03   68 y.o. Male  MRN: [id number] Visit Date: 12/21/2023  Today's healthcare provider: Rockie Agent, MD   Chief Complaint  Patient presents with   Medical Management of Chronic Issues    Patient is present for follow up of chronic concerns, reports feeling well no acute concerns    Diabetes   Subjective     HPI     Medical Management of Chronic Issues    Additional comments: Patient is present for follow up of chronic concerns, reports feeling well no acute concerns       Last edited by Cherry Chiquita HERO, CMA on 12/21/2023  8:28 AM.       Discussed the use of AI scribe software for clinical note transcription with the patient, who gave verbal consent to proceed.  History of Present Illness Antonio Whitney is a 68 year old male who presents for a follow-up visit for his chronic conditions.  He has attention deficit disorder and continues to take Adderall 10 mg daily, which is effective for him.  He has chronic sleep apnea and uses a CPAP machine nightly. He is experimenting with different masks to find the most comfortable fit and uses the CPAP consistently, both at home and while traveling.  He has a history of vitamin D  deficiency and is stable on vitamin D  2000 units daily. He takes vitamin D  every other day and alternates with Xenical. He also takes fish oil as part of his regimen.  He has colon diverticulosis but reports no symptoms related to this condition currently.  He has chronic hypertension associated with diabetes, controlled with amlodipine  5 mg and benazepril  20 mg daily.  For hyperlipidemia associated with type 2 diabetes, he continues simvastatin  20 mg daily. His last A1c was in the low sevens, and he is due for an updated A1c measurement. His morning blood sugar levels have been slightly higher, ranging from the upper 160s to lower 170s, compared to previous levels  around 150. He attributes this to a decrease in morning exercise due to work commitments. He continues to take Farxiga  2 mg daily and metformin  1000 mg twice daily.  He has a history of gout and is on preventative therapy with allopurinol  300 mg daily.  He mentions a past shoulder issue that has resolved after receiving a deep injection treatment. No current pain or limitations.  He enjoys walking daily with his wife and has reduced his morning exercise routine due to work commitments. He drinks coffee in the morning, unsweet tea at dinner, and hot tea at night, which helps curb snacking.     Past Medical History:  Diagnosis Date   Attention deficit disorder    Diabetes (HCC)    Diverticulosis 03/25/2016   Gout    Hyperlipidemia    Hypertension    Sleep apnea    CPAP    Medications: Outpatient Medications Prior to Visit  Medication Sig   Accu-Chek Softclix Lancets lancets Use as instructed   allopurinol  (ZYLOPRIM ) 300 MG tablet TAKE 1 TABLET(300 MG) BY MOUTH DAILY   amLODipine -benazepril  (LOTREL) 5-20 MG capsule Take 1 capsule by mouth daily.   amphetamine -dextroamphetamine  (ADDERALL) 10 MG tablet TAKE ONE TABLET BY MOUTH EVERY MORNING WITH BREAKFAST   blood glucose meter kit and supplies Dispense based on patient and insurance preference. Check blood sugars every morning before breakfast. (FOR ICD-10 E11.9).   Blood Glucose Monitoring  Suppl DEVI 1 each by Does not apply route in the morning, at noon, and at bedtime. May substitute to any manufacturer covered by patient's insurance.   Cholecalciferol (VITAMIN D ) 50 MCG (2000 UT) CAPS Take by mouth.   finasteride  (PROSCAR ) 5 MG tablet TAKE ONE-HALF TABLET BY MOUTH EVERY OTHER DAY   Inulin (METAMUCIL CLEAR & NATURAL) POWD Take by mouth.   meloxicam  (MOBIC ) 15 MG tablet Take 1 tablet (15 mg total) by mouth daily.   metFORMIN  (GLUCOPHAGE ) 1000 MG tablet Take 1 tablet (1,000 mg total) by mouth 2 (two) times daily with a meal.   NON  FORMULARY CPAP at night   Omega-3 Fatty Acids (FISH OIL PO) Take 1,400 mg by mouth.   simvastatin  (ZOCOR ) 20 MG tablet TAKE 1 TABLET(20 MG) BY MOUTH AT BEDTIME   Zinc 30 MG TABS Take by mouth.   [DISCONTINUED] dapagliflozin  propanediol (FARXIGA ) 10 MG TABS tablet Take 1 tablet (10 mg total) by mouth daily with breakfast.   [DISCONTINUED] glucose blood (ACCU-CHEK GUIDE) test strip CHECK BLOOD SUGARS EVERY MORNING BEFORE BREAKFAST   [DISCONTINUED] amphetamine -dextroamphetamine  (ADDERALL) 10 MG tablet TAKE ONE TABLET BY MOUTH EVERY MORNING WITH BREAKFAST   [DISCONTINUED] amphetamine -dextroamphetamine  (ADDERALL) 10 MG tablet TAKE ONE TABLET BY MOUTH EVERY MORNING WITH BREAKFAST   [DISCONTINUED] amphetamine -dextroamphetamine  (ADDERALL) 10 MG tablet TAKE ONE TABLET BY MOUTH EVERY MORNING WITH BREAKFAST   No facility-administered medications prior to visit.    Review of Systems  Last CBC Lab Results  Component Value Date   WBC 7.5 08/05/2021   HGB 14.0 08/05/2021   HCT 42.0 08/05/2021   MCV 90.7 08/05/2021   MCH 30.2 08/05/2021   RDW 13.0 08/05/2021   PLT 262 08/05/2021   Last metabolic panel Lab Results  Component Value Date   GLUCOSE 168 (H) 01/27/2023   NA 137 01/27/2023   K 4.4 01/27/2023   CL 100 01/27/2023   CO2 22 01/27/2023   BUN 17 01/27/2023   CREATININE 1.16 01/27/2023   EGFR 69 01/27/2023   CALCIUM 9.9 01/27/2023   PROT 7.5 08/17/2022   ALBUMIN 4.7 08/17/2022   LABGLOB 2.8 08/17/2022   AGRATIO 1.7 01/27/2022   BILITOT 0.6 08/17/2022   ALKPHOS 83 08/17/2022   AST 21 08/17/2022   ALT 31 08/17/2022   ANIONGAP 14 08/05/2021   Last lipids Lab Results  Component Value Date   CHOL 158 01/27/2023   HDL 78 01/27/2023   LDLCALC 65 01/27/2023   TRIG 77 01/27/2023   CHOLHDL 2.0 01/27/2023   Last hemoglobin A1c Lab Results  Component Value Date   HGBA1C 7.3 (A) 08/17/2023   Last thyroid  functions Lab Results  Component Value Date   TSH 1.320 01/27/2023    FREET4 1.51 01/27/2023   Last vitamin D  Lab Results  Component Value Date   VD25OH 43.3 01/27/2023   Lab Results  Component Value Date   LABURIC 4.7 01/27/2023       Objective    BP 119/81 (BP Location: Right Arm, Patient Position: Sitting, Cuff Size: Normal)   Pulse 85   Ht 5' 7 (1.702 m)   Wt 190 lb 6.4 oz (86.4 kg)   SpO2 100%   BMI 29.82 kg/m   BP Readings from Last 3 Encounters:  12/21/23 119/81  08/17/23 127/72  06/08/23 116/75   Wt Readings from Last 3 Encounters:  12/21/23 190 lb 6.4 oz (86.4 kg)  08/17/23 191 lb (86.6 kg)  06/08/23 192 lb (87.1 kg)  Physical Exam Vitals reviewed.  Constitutional:      General: He is not in acute distress.    Appearance: Normal appearance. He is not ill-appearing.  Cardiovascular:     Rate and Rhythm: Normal rate and regular rhythm.  Pulmonary:     Effort: Pulmonary effort is normal. No respiratory distress.     Breath sounds: No wheezing, rhonchi or rales.  Neurological:     Mental Status: He is alert and oriented to person, place, and time.  Psychiatric:        Mood and Affect: Mood normal.        Behavior: Behavior normal.       No results found for any visits on 12/21/23.   Assessment & Plan     Problem List Items Addressed This Visit     ADD (attention deficit disorder)   Attention Deficit Disorder (ADD) Chronic ADD managed with Adderall 10 mg daily. Reports no issues with current management. - Refill Adderall 10 mg daily, continue current regimen       Relevant Medications   amphetamine -dextroamphetamine  (ADDERALL) 10 MG tablet   amphetamine -dextroamphetamine  (ADDERALL) 10 MG tablet   amphetamine -dextroamphetamine  (ADDERALL) 10 MG tablet   amphetamine -dextroamphetamine  (ADDERALL) 10 MG tablet   Other Relevant Orders   CMP14+EGFR   Apnea, sleep   Chronic  Stable with CPAP use  Patient advised to continue CPAP nightly  The patient has had significant benefit from the use of CPAP  machine with considerable improvements in quality of life, work production and decrease medical complaints.  The patient will need continued maintenance and care CPAP supplies (including upgrades as deemed appropriate) in order to continue treatment for sleep apnea as this is medically necessary.   Obstructive sleep apnea is managed with CPAP therapy. He reports good compliance and satisfaction with the current CPAP setup, including a new mask and machines for home and travel.        Avitaminosis D   Chronic condition managed with vitamin D  supplementation. He takes vitamin D  2000 units every other day. - Ordered updated vitamin D  levels      Relevant Orders   VITAMIN D  25 Hydroxy (Vit-D Deficiency, Fractures)   Gout   Chronic condition managed with preventative therapy. - Continue allopurinol  300 mg daily      Hypercholesteremia   Chronic condition managed with simvastatin . - Continue simvastatin  20 mg daily - Ordered updated cholesterol panel      Relevant Orders   Lipid panel   Hypertension associated with diabetes (HCC)    Chronic condition well-controlled with current medication regimen. Blood pressure today is 119/81 mmHg. - Continue amlodipine -benazepril  5-20 mg daily      Relevant Medications   dapagliflozin  propanediol (FARXIGA ) 10 MG TABS tablet   Type 2 diabetes mellitus with hyperglycemia (HCC) - Primary   Type 2 diabetes mellitus Chronic condition with recent morning blood glucose levels in the upper 160s to lower 170s, previously around 150s. A1c has been in the low sevens. Concerns about potential increase in A1c due to decreased exercise. - Continue Farxiga  2 mg daily - Continue metformin  1000 mg twice daily - Ordered updated A1c - Provided new glucose monitoring supplies      Relevant Medications   dapagliflozin  propanediol (FARXIGA ) 10 MG TABS tablet   Other Relevant Orders   Hemoglobin A1c   Urine Microalbumin w/creat. ratio   Other Visit Diagnoses        Diabetes mellitus without complication (HCC)  Relevant Medications   dapagliflozin  propanediol (FARXIGA ) 10 MG TABS tablet       Assessment & Plan     General Health Maintenance Discussion of vaccinations and general health maintenance. He has received the flu vaccine and is up to date on pneumococcal vaccination. Shingrix vaccine is recommended but may cause flu-like symptoms. - Recommended Shingrix vaccine, advised to schedule outside of holiday time due to potential flu-like symptoms     Return in about 4 months (around 04/19/2024) for Chronic F/U, HTN, DM.         Rockie Agent, MD  [facility] [phone] (phone) [phone] (fax)  [facility]

## 2023-12-21 NOTE — Assessment & Plan Note (Signed)
 Chronic condition managed with preventative therapy. - Continue allopurinol  300 mg daily

## 2023-12-21 NOTE — Assessment & Plan Note (Signed)
 Chronic condition managed with simvastatin . - Continue simvastatin  20 mg daily - Ordered updated cholesterol panel

## 2023-12-22 LAB — CMP14+EGFR
ALT: 23 IU/L (ref 0–44)
AST: 20 IU/L (ref 0–40)
Albumin: 4.8 g/dL (ref 3.9–4.9)
Alkaline Phosphatase: 71 IU/L (ref 47–123)
BUN/Creatinine Ratio: 17 (ref 10–24)
BUN: 17 mg/dL (ref 8–27)
Bilirubin Total: 0.7 mg/dL (ref 0.0–1.2)
CO2: 23 mmol/L (ref 20–29)
Calcium: 10.1 mg/dL (ref 8.6–10.2)
Chloride: 98 mmol/L (ref 96–106)
Creatinine, Ser: 1.02 mg/dL (ref 0.76–1.27)
Globulin, Total: 2.7 g/dL (ref 1.5–4.5)
Glucose: 172 mg/dL — ABNORMAL HIGH (ref 70–99)
Potassium: 4.7 mmol/L (ref 3.5–5.2)
Sodium: 137 mmol/L (ref 134–144)
Total Protein: 7.5 g/dL (ref 6.0–8.5)
eGFR: 80 mL/min/1.73 (ref >59)

## 2023-12-22 LAB — LIPID PANEL
Chol/HDL Ratio: 2.1 ratio (ref 0.0–5.0)
Cholesterol, Total: 175 mg/dL (ref 100–199)
HDL: 82 mg/dL (ref >39)
LDL Chol Calc (NIH): 73 mg/dL (ref 0–99)
Triglycerides: 115 mg/dL (ref 0–149)
VLDL Cholesterol Cal: 20 mg/dL (ref 5–40)

## 2023-12-22 LAB — MICROALBUMIN / CREATININE URINE RATIO
Creatinine, Urine: 43.8 mg/dL
Microalb/Creat Ratio: <7 mg/g{creat} (ref 0–29)
Microalbumin, Urine: <3 ug/mL

## 2023-12-22 LAB — VITAMIN D 25 HYDROXY (VIT D DEFICIENCY, FRACTURES): Vit D, 25-Hydroxy: 39.7 ng/mL (ref 30.0–100.0)

## 2023-12-22 LAB — HEMOGLOBIN A1C
Est. average glucose Bld gHb Est-mCnc: 186 mg/dL
Hgb A1c MFr Bld: 8.1 % — ABNORMAL HIGH (ref 4.8–5.6)

## 2023-12-22 LAB — SPECIMEN STATUS REPORT

## 2023-12-29 ENCOUNTER — Ambulatory Visit: Payer: Self-pay | Admitting: Family Medicine

## 2023-12-29 DIAGNOSIS — E1165 Type 2 diabetes mellitus with hyperglycemia: Secondary | ICD-10-CM

## 2024-01-04 NOTE — Telephone Encounter (Signed)
 Spoke with patient, he is agreeable to start rybelsus . Wants to know if he needs to follow up sooner than march?

## 2024-01-05 MED ORDER — RYBELSUS 7 MG PO TABS: 7.0000 mg | ORAL_TABLET | Freq: Every day | ORAL | 2 refills | Status: AC

## 2024-01-05 MED ORDER — RYBELSUS 3 MG PO TABS: 3.0000 mg | ORAL_TABLET | Freq: Every day | ORAL | 1 refills | Status: DC

## 2024-01-05 NOTE — Telephone Encounter (Signed)
 I would like to see him in late Jan or Feb if possible   Please advise him to start rybelsus  3mg  for the next four weeks and then start 7mg  after completing the 3mg  pack

## 2024-01-11 ENCOUNTER — Telehealth: Payer: Self-pay

## 2024-01-11 NOTE — Telephone Encounter (Unsigned)
 Copied from CRM [id number]. Topic: Clinical - Lab/Test Results >> Jan 11, 2024  1:40 PM Selinda RAMAN wrote: Reason for CRM: The patient called in returning a call to [facility] regarding his lab results. He says not only has he seen them but picked up the Rybelsus  on Saturday and started it on Sunday. He is aware the provider stated A1c increased to 8.1, goal is less than 7. - would recommend starting rybelsus  3mg  and titrate up to 7mg  after 1 month then recheck A1c at next follow up . He wants to know if he is still good with his appointment in March or does this mean he needs to be seen sooner? Please assist patient further and if contacting by phone he would prefer to be contacted on his cell home and not his home phone.

## 2024-01-12 NOTE — Telephone Encounter (Signed)
 Spoke with patient, have clarified instructions for meds and r/s f/u appointment for January 29 as requested by Pcp to follow up on rybelsus 

## 2024-02-17 ENCOUNTER — Other Ambulatory Visit: Payer: Self-pay

## 2024-02-17 ENCOUNTER — Telehealth: Payer: Self-pay | Admitting: Family Medicine

## 2024-02-17 DIAGNOSIS — E7800 Pure hypercholesterolemia, unspecified: Secondary | ICD-10-CM

## 2024-02-17 MED ORDER — SIMVASTATIN 20 MG PO TABS: ORAL_TABLET | ORAL | 3 refills | Status: DC

## 2024-02-17 NOTE — Telephone Encounter (Signed)
 [facility] faxed refill request for the following medications:   simvastatin  (ZOCOR ) 20 MG tablet     Please advise.

## 2024-02-22 ENCOUNTER — Ambulatory Visit

## 2024-02-22 DIAGNOSIS — Z0000 Encounter for general adult medical examination without abnormal findings: Secondary | ICD-10-CM

## 2024-02-22 NOTE — Progress Notes (Signed)
 "  No chief complaint on file.    Subjective:   Antonio Whitney is a 69 y.o. male who presents for a Medicare Annual Wellness Visit.  Visit info / Clinical Intake: Medicare Wellness Visit Type:: Subsequent Annual Wellness Visit Persons participating in visit and providing information:: patient Medicare Wellness Visit Mode:: Telephone If telephone:: video declined If Telephone or Video please confirm:: I connected with patient using audio/video enable telemedicine. I verified patient identity with two identifiers, discussed telehealth limitations, and patient agreed to proceed. Patient Location:: HOME Provider Location:: OFFICE Interpreter Needed?: No Pre-visit prep was completed: yes AWV questionnaire completed by patient prior to visit?: yes Date:: 02/21/24 Living arrangements:: lives with spouse/significant other Patient's Overall Health Status Rating: very good Typical amount of pain: none Does pain affect daily life?: no Are you currently prescribed opioids?: no  Dietary Habits and Nutritional Risks How many meals a day?: 3 Eats fruit and vegetables daily?: yes Most meals are obtained by: preparing own meals In the last 2 weeks, have you had any of the following?: none Diabetic:: (!) yes Any non-healing wounds?: no How often do you check your BS?: 1 (EVERY MORNING) Would you like to be referred to a Nutritionist or for Diabetic Management? : no  Functional Status Activities of Daily Living (to include ambulation/medication): Independent Ambulation: Independent Medication Administration: Independent Home Management (perform basic housework or laundry): Independent Manage your own finances?: yes Primary transportation is: driving Concerns about vision?: no *vision screening is required for WTM* (BIFOCALS- PATTY VISION)  Fall Screening Falls in the past year?: 0 Number of falls in past year: 0 Was there an injury with Fall?: 0 Fall Risk Category Calculator: 0 Patient  Fall Risk Level: Low Fall Risk  Fall Risk Patient at Risk for Falls Due to: History of fall(s) Fall risk Follow up: Falls evaluation completed; Falls prevention discussed  Home and Transportation Safety: All rugs have non-skid backing?: yes All stairs or steps have railings?: yes Grab bars in the bathtub or shower?: yes Have non-skid surface in bathtub or shower?: yes Good home lighting?: yes Regular seat belt use?: yes Hospital stays in the last year:: no  Cognitive Assessment Difficulty concentrating, remembering, or making decisions? : no Will 6CIT or Mini Cog be Completed: yes What year is it?: 0 points What month is it?: 0 points Give patient an address phrase to remember (5 components): [address] [place], [place] what time is it?: 0 points Count backwards from 20 to 1: 0 points Say the months of the year in reverse: 0 points Repeat the address phrase from earlier: 0 points 6 CIT Score: 0 points  Advance Directives (For Healthcare) Does Patient Have a Medical Advance Directive?: No Would patient like information on creating a medical advance directive?: No - Patient declined  Reviewed/Updated  Reviewed/Updated: Reviewed All (Medical, Surgical, Family, Medications, Allergies, Care Teams, Patient Goals)    Allergies (verified) Patient has no known allergies.   Current Medications (verified) Outpatient Encounter Medications as of 02/22/2024  Medication Sig   Accu-Chek Softclix Lancets lancets Use as instructed   allopurinol  (ZYLOPRIM ) 300 MG tablet TAKE 1 TABLET(300 MG) BY MOUTH DAILY   amLODipine -benazepril  (LOTREL) 5-20 MG capsule Take 1 capsule by mouth daily.   amphetamine -dextroamphetamine  (ADDERALL) 10 MG tablet TAKE ONE TABLET BY MOUTH EVERY MORNING WITH BREAKFAST   blood glucose meter kit and supplies Dispense based on patient and insurance preference. Check blood sugars every morning before breakfast. (FOR ICD-10 E11.9).   Blood  Glucose Monitoring  Suppl DEVI 1 each by Does not apply route in the morning, at noon, and at bedtime. May substitute to any manufacturer covered by patient's insurance.   Cholecalciferol (VITAMIN D ) 50 MCG (2000 UT) CAPS Take by mouth.   dapagliflozin  propanediol (FARXIGA ) 10 MG TABS tablet Take 1 tablet (10 mg total) by mouth daily with breakfast.   finasteride  (PROSCAR ) 5 MG tablet TAKE ONE-HALF TABLET BY MOUTH EVERY OTHER DAY   Glucose Blood (BLOOD GLUCOSE TEST STRIPS 333) STRP Use 3 times daily to check blood glucose   Glucose Blood (BLOOD GLUCOSE TEST STRIPS) STRP 1 each by In Vitro route in the morning, at noon, and at bedtime. May substitute to any manufacturer covered by patient's insurance.   Inulin (METAMUCIL CLEAR & NATURAL) POWD Take by mouth.   meloxicam  (MOBIC ) 15 MG tablet Take 1 tablet (15 mg total) by mouth daily.   metFORMIN  (GLUCOPHAGE ) 1000 MG tablet Take 1 tablet (1,000 mg total) by mouth 2 (two) times daily with a meal.   NON FORMULARY CPAP at night   Omega-3 Fatty Acids (FISH OIL PO) Take 1,400 mg by mouth.   Semaglutide  (RYBELSUS ) 7 MG TABS Take 1 tablet (7 mg total) by mouth daily.   simvastatin  (ZOCOR ) 20 MG tablet TAKE 1 TABLET(20 MG) BY MOUTH AT BEDTIME   Zinc 30 MG TABS Take by mouth.   amphetamine -dextroamphetamine  (ADDERALL) 10 MG tablet TAKE ONE TABLET BY MOUTH EVERY MORNING WITH BREAKFAST (Patient not taking: Reported on 02/22/2024)   amphetamine -dextroamphetamine  (ADDERALL) 10 MG tablet TAKE ONE TABLET BY MOUTH EVERY MORNING WITH BREAKFAST (Patient not taking: Reported on 02/22/2024)   amphetamine -dextroamphetamine  (ADDERALL) 10 MG tablet TAKE ONE TABLET BY MOUTH EVERY MORNING WITH BREAKFAST (Patient not taking: Reported on 02/22/2024)   amphetamine -dextroamphetamine  (ADDERALL) 10 MG tablet TAKE ONE TABLET BY MOUTH EVERY MORNING WITH BREAKFAST (Patient not taking: Reported on 02/22/2024)   Semaglutide  (RYBELSUS ) 3 MG TABS Take 1 tablet (3 mg total) by mouth daily. (Patient not taking:  Reported on 02/22/2024)   No facility-administered encounter medications on file as of 02/22/2024.    History: Past Medical History:  Diagnosis Date   Attention deficit disorder    Diabetes (HCC)    Diverticulosis 03/25/2016   Gout    Hyperlipidemia    Hypertension    Sleep apnea    CPAP   Past Surgical History:  Procedure Laterality Date   APPENDECTOMY     COLONOSCOPY WITH PROPOFOL  N/A 11/18/2022   Procedure: COLONOSCOPY WITH PROPOFOL ;  Surgeon: Unk Corinn Skiff, MD;  Location: [facility] ENDOSCOPY;  Service: Gastroenterology;  Laterality: N/A;   FRACTURE SURGERY  08/05/2021   HERNIA REPAIR     inguinal left-1980   POLYPECTOMY  11/18/2022   Procedure: POLYPECTOMY;  Surgeon: Unk Corinn Skiff, MD;  Location: [facility] ENDOSCOPY;  Service: Gastroenterology;;   SYNDESMOSIS REPAIR Left 08/05/2021   Procedure: SYNDESMOTIC STABILIZATION OF MAISONNEUVE FRACTURE, LEFT ANKLE;  Surgeon: Edie Norleen PARAS, MD;  Location: [facility] ORS;  Service: Orthopedics;  Laterality: Left;   Family History  Problem Relation Age of Onset   Breast cancer Mother    Lung cancer Father    Social History   Occupational History   Occupation: art gallery manager now  Tobacco Use   Smoking status: Never   Smokeless tobacco: Never  Vaping Use   Vaping status: Never Used  Substance and Sexual Activity   Alcohol use: No   Drug use: No   Sexual activity: Yes    Birth control/protection: Surgical  Tobacco Counseling Counseling given: Not Answered  SDOH Screenings   Food Insecurity: No Food Insecurity (02/22/2024)  Housing: Unknown (02/22/2024)  Transportation Needs: No Transportation Needs (02/22/2024)  Utilities: Not At Risk (02/22/2024)  Alcohol Screen: Low Risk (01/23/2022)  Depression (PHQ2-9): Low Risk (02/22/2024)  Financial Resource Strain: Low Risk (02/21/2024)  Physical Activity: Sufficiently Active (02/22/2024)  Social Connections: Socially Isolated (02/22/2024)  Stress: No Stress Concern Present  (02/21/2024)  Tobacco Use: Low Risk (12/21/2023)  Health Literacy: Adequate Health Literacy (02/22/2024)   See flowsheets for full screening details  Depression Screen PHQ 2 & 9 Depression Scale- Over the past 2 weeks, how often have you been bothered by any of the following problems? Little interest or pleasure in doing things: 0 Feeling down, depressed, or hopeless (PHQ Adolescent also includes...irritable): 0 PHQ-2 Total Score: 0 Trouble falling or staying asleep, or sleeping too much: 0 Feeling tired or having little energy: 0 Poor appetite or overeating (PHQ Adolescent also includes...weight loss): 0 Feeling bad about yourself - or that you are a failure or have let yourself or your family down: 0 Trouble concentrating on things, such as reading the newspaper or watching television (PHQ Adolescent also includes...like school work): 0 Moving or speaking so slowly that other people could have noticed. Or the opposite - being so fidgety or restless that you have been moving around a lot more than usual: 0 Thoughts that you would be better off dead, or of hurting yourself in some way: 0 PHQ-9 Total Score: 0 If you checked off any problems, how difficult have these problems made it for you to do your work, take care of things at home, or get along with other people?: Not difficult at all  Depression Treatment Depression Interventions/Treatment : EYV7-0 Score <4 Follow-up Not Indicated     Goals Addressed             This Visit's Progress    DIET - EAT MORE FRUITS AND VEGETABLES               Objective:    There were no vitals filed for this visit. There is no height or weight on file to calculate BMI.  Hearing/Vision screen Hearing Screening - Comments:: NO AIDS Vision Screening - Comments:: BIFOCALS- PATTY VISION- YEARLY- NO DIABETIC RETINOPATHY  Immunizations and Health Maintenance Health Maintenance  Topic Date Due   Zoster Vaccines- Shingrix (1 of 2) 08/04/1974    COVID-19 Vaccine (8 - 2025-26 season) 10/11/2023   FOOT EXAM  01/27/2024   HEMOGLOBIN A1C  06/19/2024   OPHTHALMOLOGY EXAM  07/27/2024   Diabetic kidney evaluation - eGFR measurement  12/20/2024   Diabetic kidney evaluation - Urine ACR  12/20/2024   Medicare Annual Wellness (AWV)  02/21/2025   Colonoscopy  11/17/2025   DTaP/Tdap/Td (3 - Td or Tdap) 12/10/2030   Pneumococcal Vaccine: 50+ Years  Completed   Influenza Vaccine  Completed   Hepatitis C Screening  Completed   Meningococcal B Vaccine  Aged Out        Assessment/Plan:  This is a routine wellness examination for Antonio Whitney.  Patient Care Team: Sharma Coyer, MD as PCP - General (Family Medicine) [facility], [facility]  I have personally reviewed and noted the following in the patients chart:   Medical and social history Use of alcohol, tobacco or illicit drugs  Current medications and supplements including opioid prescriptions. Functional ability and status Nutritional status Physical activity Advanced directives List of other physicians Hospitalizations, surgeries, and  ER visits in previous 12 months Vitals Screenings to include cognitive, depression, and falls Referrals and appointments  No orders of the defined types were placed in this encounter.  In addition, I have reviewed and discussed with patient certain preventive protocols, quality metrics, and best practice recommendations. A written personalized care plan for preventive services as well as general preventive health recommendations were provided to patient.   Antonio GORMAN Das, LPN   [id number]/86/7973   Return in 1 year (on 02/21/2025).  After Visit Summary: (MyChart) Due to this being a telephonic visit, the after visit summary with patients personalized plan was offered to patient via MyChart   Nurse Notes: UTD ON SHOTS EXCEPT SHINGRIX; NEEDS FOOT EXAM; UTD COLONOSCOPY   "

## 2024-02-22 NOTE — Patient Instructions (Addendum)
 [name],  Thank you for taking the time for your Medicare Wellness Visit. I appreciate your continued commitment to your health goals. Please review the care plan we discussed, and feel free to reach out if I can assist you further.  Please note that Annual Wellness Visits do not include a physical exam. Some assessments may be limited, especially if the visit was conducted virtually. If needed, we may recommend an in-person follow-up with your provider.  Ongoing Care Seeing your primary care provider every 3 to 6 months helps us  monitor your health and provide consistent, personalized care. APPT W/ DR.[email or web address] ON 03/09/24 @ 8:00 AM  Referrals If a referral was made during today's visit and you haven't received any updates within two weeks, please contact the referred provider directly to check on the status.  Recommended Screenings:  Health Maintenance  Topic Date Due   Zoster (Shingles) Vaccine (1 of 2) 08/04/1974   COVID-19 Vaccine (8 - 2025-26 season) 10/11/2023   Complete foot exam   01/27/2024   Hemoglobin A1C  06/19/2024   Eye exam for diabetics  07/27/2024   Yearly kidney function blood test for diabetes  12/20/2024   Yearly kidney health urinalysis for diabetes  12/20/2024   Medicare Annual Wellness Visit  02/21/2025   Colon Cancer Screening  11/17/2025   DTaP/Tdap/Td vaccine (3 - Td or Tdap) 12/10/2030   Pneumococcal Vaccine for age over 27  Completed   Flu Shot  Completed   Hepatitis C Screening  Completed   Meningitis B Vaccine  Aged Out    Vision: Annual vision screenings are recommended for early detection of glaucoma, cataracts, and diabetic retinopathy. These exams can also reveal signs of chronic conditions such as diabetes and high blood pressure.  Dental: Annual dental screenings help detect early signs of oral cancer, gum disease, and other conditions linked to overall health, including heart disease and diabetes.  Please see the attached documents  for additional preventive care recommendations.   NEXT AWV  02/22/24 @ 2:30 PM BY VIDEO

## 2024-03-09 ENCOUNTER — Encounter: Payer: Self-pay | Admitting: Family Medicine

## 2024-03-09 ENCOUNTER — Ambulatory Visit: Admitting: Family Medicine

## 2024-03-09 VITALS — BP 127/81 | HR 81 | Ht 67.0 in | Wt 188.0 lb

## 2024-03-09 DIAGNOSIS — E1165 Type 2 diabetes mellitus with hyperglycemia: Secondary | ICD-10-CM | POA: Diagnosis not present

## 2024-03-09 DIAGNOSIS — B351 Tinea unguium: Secondary | ICD-10-CM

## 2024-03-09 DIAGNOSIS — B353 Tinea pedis: Secondary | ICD-10-CM | POA: Diagnosis not present

## 2024-03-09 DIAGNOSIS — E1159 Type 2 diabetes mellitus with other circulatory complications: Secondary | ICD-10-CM | POA: Diagnosis not present

## 2024-03-09 DIAGNOSIS — N400 Enlarged prostate without lower urinary tract symptoms: Secondary | ICD-10-CM | POA: Diagnosis not present

## 2024-03-09 DIAGNOSIS — I152 Hypertension secondary to endocrine disorders: Secondary | ICD-10-CM

## 2024-03-09 DIAGNOSIS — Z7984 Long term (current) use of oral hypoglycemic drugs: Secondary | ICD-10-CM | POA: Diagnosis not present

## 2024-03-09 DIAGNOSIS — F900 Attention-deficit hyperactivity disorder, predominantly inattentive type: Secondary | ICD-10-CM

## 2024-03-09 DIAGNOSIS — E1169 Type 2 diabetes mellitus with other specified complication: Secondary | ICD-10-CM

## 2024-03-09 DIAGNOSIS — E119 Type 2 diabetes mellitus without complications: Secondary | ICD-10-CM

## 2024-03-09 DIAGNOSIS — E559 Vitamin D deficiency, unspecified: Secondary | ICD-10-CM

## 2024-03-09 DIAGNOSIS — E7800 Pure hypercholesterolemia, unspecified: Secondary | ICD-10-CM | POA: Diagnosis not present

## 2024-03-09 DIAGNOSIS — G4739 Other sleep apnea: Secondary | ICD-10-CM | POA: Diagnosis not present

## 2024-03-09 DIAGNOSIS — M109 Gout, unspecified: Secondary | ICD-10-CM

## 2024-03-09 DIAGNOSIS — R41840 Attention and concentration deficit: Secondary | ICD-10-CM

## 2024-03-09 LAB — POCT GLYCOSYLATED HEMOGLOBIN (HGB A1C): Hemoglobin A1C: 7.4 % — AB (ref 4.0–5.6)

## 2024-03-09 MED ORDER — AMPHETAMINE-DEXTROAMPHETAMINE 10 MG PO TABS: ORAL_TABLET | ORAL | 0 refills | Status: AC

## 2024-03-09 MED ORDER — DAPAGLIFLOZIN PROPANEDIOL 10 MG PO TABS: 10.0000 mg | ORAL_TABLET | Freq: Every day | ORAL | 3 refills | Status: AC

## 2024-03-09 MED ORDER — ALLOPURINOL 300 MG PO TABS: ORAL_TABLET | ORAL | 3 refills | Status: AC

## 2024-03-09 MED ORDER — SIMVASTATIN 20 MG PO TABS: ORAL_TABLET | ORAL | 3 refills | Status: AC

## 2024-03-09 NOTE — Patient Instructions (Signed)
 To keep you healthy, please keep in mind the following health maintenance items that you are due for:   Health Maintenance Due  Topic Date Due   Zoster Vaccines- Shingrix (1 of 2) [birth year]/04/1974   COVID-19 Vaccine (8 - 2025-26 season) 10/11/2023     Best Wishes,   Dr. Lang

## 2024-03-10 ENCOUNTER — Encounter: Payer: Self-pay | Admitting: Family Medicine

## 2024-03-10 DIAGNOSIS — N400 Enlarged prostate without lower urinary tract symptoms: Secondary | ICD-10-CM | POA: Insufficient documentation

## 2024-03-10 NOTE — Assessment & Plan Note (Signed)
" °  Chronic condition well-controlled with current medication regimen. - Continue amlodipine -benazepril  5-20 mg daily "

## 2024-03-10 NOTE — Assessment & Plan Note (Signed)
 Chronic condition managed with finasteride . - Continue finasteride  5 mg every other day.

## 2024-03-10 NOTE — Assessment & Plan Note (Signed)
 Chronic  Type 2 diabetes with recent A1c of 8.1% in November. Currently on Rybelsus , titrated from 3 mg to 7 mg over two months. Reports weight loss and improved morning blood sugars, though not markedly better. Hypertension managed with amlodipine -benazepril . - Continue Rybelsus  7 mg daily, will titrate to 14 mg after current supply. - Continue metformin  1000 mg twice daily. - Continue Farxiga  10 mg daily. - Continue amlodipine -benazepril  5-20 mg daily. - Will complete diabetes foot exam today. - Will schedule follow-up in four months to reassess A1c and diabetes management.

## 2024-03-10 NOTE — Assessment & Plan Note (Signed)
 Chronic condition managed with preventative therapy. - Continue allopurinol  300 mg daily

## 2024-03-10 NOTE — Assessment & Plan Note (Signed)
 Chronic condition managed with supplementation. Recent vitamin D  level within goal range at 39 ng/mL. - Continue vitamin D  2000 units every other day.

## 2024-03-10 NOTE — Assessment & Plan Note (Signed)
 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 - Continue Adderall 10 mg daily. - Refilled Adderall prescription and coordinated with pharmacy for future refills.

## 2024-03-10 NOTE — Assessment & Plan Note (Signed)
Chronic  Stable with CPAP use  Patient advised to continue CPAP nightly  The patient has had significant benefit from the use of CPAP machine with considerable improvements in quality of life, work production and decrease medical complaints.  The patient will need continued maintenance and care CPAP supplies (including upgrades as deemed appropriate) in order to continue treatment for sleep apnea as this is medically necessary.     

## 2024-03-10 NOTE — Assessment & Plan Note (Signed)
 Chronic  Managed with simvastatin . - Continue simvastatin  20 mg daily.

## 2024-04-19 ENCOUNTER — Ambulatory Visit: Admitting: Family Medicine

## 2024-06-27 ENCOUNTER — Ambulatory Visit: Admitting: Family Medicine

## 2025-02-21 ENCOUNTER — Ambulatory Visit
